# Patient Record
Sex: Female | Born: 1960 | Race: Black or African American | Hispanic: No | Marital: Single
Health system: Southern US, Community
[De-identification: ages and names within clinical notes are randomized; demographics above are authoritative.]

## PROBLEM LIST (undated history)

## (undated) DIAGNOSIS — H269 Unspecified cataract: Secondary | ICD-10-CM

## (undated) DIAGNOSIS — E785 Hyperlipidemia, unspecified: Secondary | ICD-10-CM

## (undated) DIAGNOSIS — E119 Type 2 diabetes mellitus without complications: Secondary | ICD-10-CM

## (undated) DIAGNOSIS — F32A Depression, unspecified: Secondary | ICD-10-CM

## (undated) DIAGNOSIS — I639 Cerebral infarction, unspecified: Secondary | ICD-10-CM

## (undated) DIAGNOSIS — I10 Essential (primary) hypertension: Secondary | ICD-10-CM

## (undated) DIAGNOSIS — I509 Heart failure, unspecified: Secondary | ICD-10-CM

## (undated) DIAGNOSIS — F419 Anxiety disorder, unspecified: Secondary | ICD-10-CM

## (undated) DIAGNOSIS — E039 Hypothyroidism, unspecified: Secondary | ICD-10-CM

## (undated) DIAGNOSIS — K219 Gastro-esophageal reflux disease without esophagitis: Secondary | ICD-10-CM

## (undated) DIAGNOSIS — R1310 Dysphagia, unspecified: Secondary | ICD-10-CM

## (undated) DIAGNOSIS — F329 Major depressive disorder, single episode, unspecified: Secondary | ICD-10-CM

## (undated) DIAGNOSIS — G8190 Hemiplegia, unspecified affecting unspecified side: Secondary | ICD-10-CM

## (undated) HISTORY — DX: Hemiplegia, unspecified affecting unspecified side: G81.90

## (undated) HISTORY — DX: Gastro-esophageal reflux disease without esophagitis: K21.9

## (undated) HISTORY — DX: Cerebral infarction, unspecified: I63.9

## (undated) HISTORY — DX: Depression, unspecified: F32.A

## (undated) HISTORY — DX: Hyperlipidemia, unspecified: E78.5

## (undated) HISTORY — DX: Essential (primary) hypertension: I10

## (undated) HISTORY — DX: Anxiety disorder, unspecified: F41.9

## (undated) HISTORY — DX: Type 2 diabetes mellitus without complications: E11.9

## (undated) HISTORY — DX: Unspecified cataract: H26.9

## (undated) HISTORY — DX: Dysphagia, unspecified: R13.10

## (undated) HISTORY — DX: Heart failure, unspecified: I50.9

## (undated) HISTORY — DX: Hypothyroidism, unspecified: E03.9

---
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
## 2003-07-20 ENCOUNTER — Other Ambulatory Visit: Payer: Self-pay

## 2003-07-24 ENCOUNTER — Inpatient Hospital Stay (HOSPITAL_COMMUNITY)
Admission: RE | Admit: 2003-07-24 | Discharge: 2003-08-16 | Payer: Self-pay | Admitting: Physical Medicine & Rehabilitation

## 2003-09-23 ENCOUNTER — Encounter
Admission: RE | Admit: 2003-09-23 | Discharge: 2003-12-22 | Payer: Self-pay | Admitting: Physical Medicine & Rehabilitation

## 2004-02-28 ENCOUNTER — Encounter: Payer: Self-pay | Admitting: Physical Medicine & Rehabilitation

## 2004-03-30 ENCOUNTER — Encounter: Payer: Self-pay | Admitting: Physical Medicine & Rehabilitation

## 2004-04-29 ENCOUNTER — Encounter: Payer: Self-pay | Admitting: Physical Medicine & Rehabilitation

## 2004-08-07 ENCOUNTER — Emergency Department: Payer: Self-pay | Admitting: Emergency Medicine

## 2004-09-30 ENCOUNTER — Emergency Department: Payer: Self-pay | Admitting: Emergency Medicine

## 2006-03-01 ENCOUNTER — Encounter: Payer: Self-pay | Admitting: Family Medicine

## 2006-09-01 ENCOUNTER — Inpatient Hospital Stay: Payer: Self-pay | Admitting: Internal Medicine

## 2006-09-01 ENCOUNTER — Other Ambulatory Visit: Payer: Self-pay

## 2008-04-17 ENCOUNTER — Inpatient Hospital Stay: Payer: Self-pay | Admitting: Internal Medicine

## 2010-02-25 ENCOUNTER — Emergency Department: Payer: Self-pay | Admitting: Emergency Medicine

## 2010-11-23 ENCOUNTER — Inpatient Hospital Stay: Payer: Self-pay | Admitting: *Deleted

## 2012-12-28 ENCOUNTER — Ambulatory Visit: Payer: Self-pay | Admitting: Internal Medicine

## 2013-01-09 LAB — CBC
HGB: 12.3 g/dL (ref 12.0–16.0)
MCH: 26.4 pg (ref 26.0–34.0)
Platelet: 367 10*3/uL (ref 150–440)
RDW: 19.7 % — ABNORMAL HIGH (ref 11.5–14.5)

## 2013-01-09 LAB — ETHANOL
Ethanol %: <0.003 % (ref 0.000–0.080)
Ethanol: <3 mg/dL

## 2013-01-09 LAB — DRUG SCREEN, URINE
Benzodiazepine, Ur Scrn: NEGATIVE (ref ?–200)
Cannabinoid 50 Ng, Ur ~~LOC~~: NEGATIVE (ref ?–50)
MDMA (Ecstasy)Ur Screen: NEGATIVE (ref ?–500)
Phencyclidine (PCP) Ur S: NEGATIVE (ref ?–25)
Tricyclic, Ur Screen: NEGATIVE (ref ?–1000)

## 2013-01-09 LAB — URINALYSIS, COMPLETE
Glucose,UR: 300 mg/dL (ref 0–75)
Nitrite: NEGATIVE
Ph: 6 (ref 4.5–8.0)
Specific Gravity: 1.005 (ref 1.003–1.030)
Squamous Epithelial: 6
WBC UR: 62 /HPF (ref 0–5)

## 2013-01-09 LAB — COMPREHENSIVE METABOLIC PANEL
Anion Gap: 11 (ref 7–16)
Bilirubin,Total: 2.9 mg/dL — ABNORMAL HIGH (ref 0.2–1.0)
Chloride: 100 mmol/L (ref 98–107)
Glucose: 499 mg/dL — ABNORMAL HIGH (ref 65–99)
SGOT(AST): 56 U/L — ABNORMAL HIGH (ref 15–37)
Sodium: 140 mmol/L (ref 136–145)

## 2013-01-09 LAB — TROPONIN I: Troponin-I: 0.56 ng/mL — ABNORMAL HIGH

## 2013-01-10 ENCOUNTER — Inpatient Hospital Stay: Payer: Self-pay | Admitting: Internal Medicine

## 2013-01-11 LAB — CBC WITH DIFFERENTIAL/PLATELET
Basophil #: 0.2 10*3/uL — ABNORMAL HIGH (ref 0.0–0.1)
Basophil %: 0.8 %
Eosinophil #: 0 10*3/uL (ref 0.0–0.7)
Eosinophil %: 0 %
HCT: 42 % (ref 35.0–47.0)
HGB: 13.5 g/dL (ref 12.0–16.0)
Lymphocyte %: 9.9 %
MCHC: 32.1 g/dL (ref 32.0–36.0)
MCV: 83 fL (ref 80–100)
Monocyte %: 7.7 %
Neutrophil #: 15 10*3/uL — ABNORMAL HIGH (ref 1.4–6.5)
Platelet: 280 10*3/uL (ref 150–440)
WBC: 18.4 10*3/uL — ABNORMAL HIGH (ref 3.6–11.0)

## 2013-01-11 LAB — COMPREHENSIVE METABOLIC PANEL
Alkaline Phosphatase: 79 U/L (ref 50–136)
BUN: 25 mg/dL — ABNORMAL HIGH (ref 7–18)
Calcium, Total: 8.8 mg/dL (ref 8.5–10.1)
Co2: 39 mmol/L — ABNORMAL HIGH (ref 21–32)
Creatinine: 1.25 mg/dL (ref 0.60–1.30)
EGFR (African American): 57 — ABNORMAL LOW
Glucose: 233 mg/dL — ABNORMAL HIGH (ref 65–99)
Potassium: 2.3 mmol/L — CL (ref 3.5–5.1)
SGOT(AST): 22 U/L (ref 15–37)
SGPT (ALT): 22 U/L (ref 12–78)
Sodium: 143 mmol/L (ref 136–145)
Total Protein: 8 g/dL (ref 6.4–8.2)

## 2013-01-11 LAB — LIPID PANEL
Cholesterol: 119 mg/dL (ref 0–200)
Ldl Cholesterol, Calc: 54 mg/dL (ref 0–100)
VLDL Cholesterol, Calc: 36 mg/dL (ref 5–40)

## 2013-01-11 LAB — HEMOGLOBIN A1C: Hemoglobin A1C: 8.2 % — ABNORMAL HIGH (ref 4.2–6.3)

## 2013-01-12 LAB — CREATININE, SERUM
Creatinine: 1.33 mg/dL — ABNORMAL HIGH (ref 0.60–1.30)
EGFR (Non-African Amer.): 46 — ABNORMAL LOW

## 2013-01-13 LAB — CBC WITH DIFFERENTIAL/PLATELET
Basophil #: 0 10*3/uL (ref 0.0–0.1)
Basophil %: 0.2 %
Eosinophil %: 1.3 %
Lymphocyte %: 15.6 %
MCH: 26.3 pg (ref 26.0–34.0)
MCV: 83 fL (ref 80–100)
Monocyte %: 5.5 %
Neutrophil #: 11.5 10*3/uL — ABNORMAL HIGH (ref 1.4–6.5)
Neutrophil %: 77.4 %
Platelet: 280 10*3/uL (ref 150–440)
RDW: 19.5 % — ABNORMAL HIGH (ref 11.5–14.5)
WBC: 14.9 10*3/uL — ABNORMAL HIGH (ref 3.6–11.0)

## 2013-01-13 LAB — BASIC METABOLIC PANEL
Anion Gap: 5 — ABNORMAL LOW (ref 7–16)
BUN: 28 mg/dL — ABNORMAL HIGH (ref 7–18)
Chloride: 94 mmol/L — ABNORMAL LOW (ref 98–107)
Co2: 39 mmol/L — ABNORMAL HIGH (ref 21–32)
Osmolality: 281 (ref 275–301)

## 2013-01-13 LAB — MAGNESIUM: Magnesium: 1.5 mg/dL — ABNORMAL LOW

## 2013-01-14 LAB — BASIC METABOLIC PANEL
Anion Gap: 2 — ABNORMAL LOW (ref 7–16)
Calcium, Total: 7.9 mg/dL — ABNORMAL LOW (ref 8.5–10.1)
Chloride: 96 mmol/L — ABNORMAL LOW (ref 98–107)
Creatinine: 1.27 mg/dL (ref 0.60–1.30)
EGFR (African American): 56 — ABNORMAL LOW
EGFR (Non-African Amer.): 48 — ABNORMAL LOW
Glucose: 112 mg/dL — ABNORMAL HIGH (ref 65–99)
Osmolality: 280 (ref 275–301)
Potassium: 3.3 mmol/L — ABNORMAL LOW (ref 3.5–5.1)
Sodium: 138 mmol/L (ref 136–145)

## 2013-01-14 LAB — URINE CULTURE

## 2013-01-14 LAB — CULTURE, BLOOD (SINGLE)

## 2013-01-14 LAB — WBC: WBC: 11.8 10*3/uL — ABNORMAL HIGH (ref 3.6–11.0)

## 2013-01-14 LAB — MAGNESIUM: Magnesium: 1.6 mg/dL — ABNORMAL LOW

## 2013-01-28 ENCOUNTER — Ambulatory Visit: Payer: Self-pay | Admitting: Nurse Practitioner

## 2013-02-27 ENCOUNTER — Ambulatory Visit: Payer: Self-pay | Admitting: Internal Medicine

## 2013-03-30 ENCOUNTER — Ambulatory Visit: Payer: Self-pay | Admitting: Nurse Practitioner

## 2013-05-30 ENCOUNTER — Ambulatory Visit: Payer: Self-pay | Admitting: Nurse Practitioner

## 2013-07-28 ENCOUNTER — Ambulatory Visit: Payer: Self-pay | Admitting: Nurse Practitioner

## 2013-08-28 ENCOUNTER — Ambulatory Visit: Payer: Self-pay | Admitting: Nurse Practitioner

## 2013-10-28 ENCOUNTER — Ambulatory Visit
Admit: 2013-10-28 | Disposition: A | Discharge status: Home / Self Care | Payer: Self-pay | Attending: Nurse Practitioner | Admitting: Nurse Practitioner

## 2013-11-27 ENCOUNTER — Ambulatory Visit
Admit: 2013-11-27 | Disposition: A | Discharge status: Home / Self Care | Payer: Self-pay | Attending: Nurse Practitioner | Admitting: Nurse Practitioner

## 2014-03-11 ENCOUNTER — Inpatient Hospital Stay: Payer: Self-pay | Admitting: Internal Medicine

## 2014-03-11 LAB — CK: CK, Total: 64 U/L

## 2014-03-11 LAB — BASIC METABOLIC PANEL
Anion Gap: 8 (ref 7–16)
BUN: 41 mg/dL — ABNORMAL HIGH (ref 7–18)
CO2: 29 mmol/L (ref 21–32)
Calcium, Total: 8.2 mg/dL — ABNORMAL LOW (ref 8.5–10.1)
Chloride: 103 mmol/L (ref 98–107)
Creatinine: 2.63 mg/dL — ABNORMAL HIGH (ref 0.60–1.30)
EGFR (Non-African Amer.): 20 — ABNORMAL LOW
GFR CALC AF AMER: 24 — AB
Glucose: 209 mg/dL — ABNORMAL HIGH (ref 65–99)
OSMOLALITY: 296 (ref 275–301)
Potassium: 2.9 mmol/L — ABNORMAL LOW (ref 3.5–5.1)
Sodium: 140 mmol/L (ref 136–145)

## 2014-03-11 LAB — COMPREHENSIVE METABOLIC PANEL
ALBUMIN: 2.5 g/dL — AB (ref 3.4–5.0)
ANION GAP: 14 (ref 7–16)
Alkaline Phosphatase: 112 U/L
BUN: 44 mg/dL — AB (ref 7–18)
Bilirubin,Total: 0.4 mg/dL (ref 0.2–1.0)
CHLORIDE: 96 mmol/L — AB (ref 98–107)
CREATININE: 2.82 mg/dL — AB (ref 0.60–1.30)
Calcium, Total: 9.2 mg/dL (ref 8.5–10.1)
Co2: 27 mmol/L (ref 21–32)
EGFR (African American): 23 — ABNORMAL LOW
EGFR (Non-African Amer.): 19 — ABNORMAL LOW
Glucose: 264 mg/dL — ABNORMAL HIGH (ref 65–99)
Osmolality: 294 (ref 275–301)
Potassium: 2.4 mmol/L — CL (ref 3.5–5.1)
SGOT(AST): 12 U/L — ABNORMAL LOW (ref 15–37)
SGPT (ALT): 12 U/L — ABNORMAL LOW
SODIUM: 137 mmol/L (ref 136–145)
Total Protein: 8.5 g/dL — ABNORMAL HIGH (ref 6.4–8.2)

## 2014-03-11 LAB — URINALYSIS, COMPLETE
Bilirubin,UR: NEGATIVE
GLUCOSE, UR: NEGATIVE mg/dL (ref 0–75)
Hyaline Cast: 3
NITRITE: NEGATIVE
Ph: 5 (ref 4.5–8.0)
RBC,UR: 2 /HPF (ref 0–5)
Specific Gravity: 1.013 (ref 1.003–1.030)
WBC UR: 158 /HPF (ref 0–5)

## 2014-03-11 LAB — CBC WITH DIFFERENTIAL/PLATELET
BASOS ABS: 0.1 10*3/uL (ref 0.0–0.1)
BASOS PCT: 0.6 %
EOS ABS: 0 10*3/uL (ref 0.0–0.7)
EOS PCT: 0.2 %
HCT: 31.7 % — ABNORMAL LOW (ref 35.0–47.0)
HGB: 9.9 g/dL — AB (ref 12.0–16.0)
LYMPHS ABS: 2.6 10*3/uL (ref 1.0–3.6)
LYMPHS PCT: 11.8 %
MCH: 25.7 pg — ABNORMAL LOW (ref 26.0–34.0)
MCHC: 31.2 g/dL — ABNORMAL LOW (ref 32.0–36.0)
MCV: 83 fL (ref 80–100)
Monocyte #: 1.4 x10 3/mm — ABNORMAL HIGH (ref 0.2–0.9)
Monocyte %: 6.3 %
NEUTROS ABS: 17.5 10*3/uL — AB (ref 1.4–6.5)
Neutrophil %: 81.1 %
Platelet: 536 10*3/uL — ABNORMAL HIGH (ref 150–440)
RBC: 3.84 10*6/uL (ref 3.80–5.20)
RDW: 14.9 % — ABNORMAL HIGH (ref 11.5–14.5)
WBC: 21.5 10*3/uL — ABNORMAL HIGH (ref 3.6–11.0)

## 2014-03-11 LAB — POTASSIUM: Potassium: 2.4 mmol/L — CL (ref 3.5–5.1)

## 2014-03-11 LAB — TROPONIN I: TROPONIN-I: 0.05 ng/mL

## 2014-03-11 LAB — LIPASE, BLOOD: LIPASE: 4721 U/L — AB (ref 73–393)

## 2014-03-11 LAB — MAGNESIUM: Magnesium: 1.9 mg/dL

## 2014-03-12 LAB — CBC WITH DIFFERENTIAL/PLATELET
Basophil #: 0 10*3/uL (ref 0.0–0.1)
Basophil %: 0.3 %
Eosinophil #: 0.1 10*3/uL (ref 0.0–0.7)
Eosinophil %: 0.4 %
HCT: 28.9 % — ABNORMAL LOW (ref 35.0–47.0)
HGB: 9 g/dL — ABNORMAL LOW (ref 12.0–16.0)
LYMPHS ABS: 1.7 10*3/uL (ref 1.0–3.6)
LYMPHS PCT: 12.3 %
MCH: 26 pg (ref 26.0–34.0)
MCHC: 30.9 g/dL — ABNORMAL LOW (ref 32.0–36.0)
MCV: 84 fL (ref 80–100)
MONO ABS: 1.1 x10 3/mm — AB (ref 0.2–0.9)
MONOS PCT: 7.7 %
NEUTROS ABS: 11 10*3/uL — AB (ref 1.4–6.5)
NEUTROS PCT: 79.3 %
Platelet: 349 10*3/uL (ref 150–440)
RBC: 3.44 10*6/uL — ABNORMAL LOW (ref 3.80–5.20)
RDW: 14.4 % (ref 11.5–14.5)
WBC: 13.9 10*3/uL — ABNORMAL HIGH (ref 3.6–11.0)

## 2014-03-12 LAB — LIPID PANEL
CHOLESTEROL: 69 mg/dL (ref 0–200)
HDL Cholesterol: 34 mg/dL — ABNORMAL LOW (ref 40–60)
Ldl Cholesterol, Calc: 18 mg/dL (ref 0–100)
Triglycerides: 83 mg/dL (ref 0–200)
VLDL CHOLESTEROL, CALC: 17 mg/dL (ref 5–40)

## 2014-03-12 LAB — COMPREHENSIVE METABOLIC PANEL
ALBUMIN: 2 g/dL — AB (ref 3.4–5.0)
ALK PHOS: 94 U/L
ANION GAP: 10 (ref 7–16)
BUN: 34 mg/dL — AB (ref 7–18)
Bilirubin,Total: 0.2 mg/dL (ref 0.2–1.0)
CHLORIDE: 107 mmol/L (ref 98–107)
CO2: 25 mmol/L (ref 21–32)
CREATININE: 2.21 mg/dL — AB (ref 0.60–1.30)
Calcium, Total: 8.2 mg/dL — ABNORMAL LOW (ref 8.5–10.1)
EGFR (African American): 30 — ABNORMAL LOW
GFR CALC NON AF AMER: 25 — AB
GLUCOSE: 198 mg/dL — AB (ref 65–99)
OSMOLALITY: 296 (ref 275–301)
POTASSIUM: 3.1 mmol/L — AB (ref 3.5–5.1)
SGOT(AST): 15 U/L (ref 15–37)
SGPT (ALT): 10 U/L — ABNORMAL LOW
SODIUM: 142 mmol/L (ref 136–145)
Total Protein: 6.8 g/dL (ref 6.4–8.2)

## 2014-03-12 LAB — MAGNESIUM: MAGNESIUM: 1.7 mg/dL — AB

## 2014-03-13 LAB — BASIC METABOLIC PANEL
ANION GAP: 7 (ref 7–16)
BUN: 22 mg/dL — AB (ref 7–18)
Calcium, Total: 8.5 mg/dL (ref 8.5–10.1)
Chloride: 108 mmol/L — ABNORMAL HIGH (ref 98–107)
Co2: 26 mmol/L (ref 21–32)
Creatinine: 1.89 mg/dL — ABNORMAL HIGH (ref 0.60–1.30)
GFR CALC AF AMER: 36 — AB
GFR CALC NON AF AMER: 30 — AB
GLUCOSE: 170 mg/dL — AB (ref 65–99)
OSMOLALITY: 289 (ref 275–301)
POTASSIUM: 3.3 mmol/L — AB (ref 3.5–5.1)
SODIUM: 141 mmol/L (ref 136–145)

## 2014-03-13 LAB — CBC WITH DIFFERENTIAL/PLATELET
BASOS PCT: 0.1 %
Basophil #: 0 10*3/uL (ref 0.0–0.1)
EOS ABS: 0.1 10*3/uL (ref 0.0–0.7)
EOS PCT: 0.7 %
HCT: 28 % — AB (ref 35.0–47.0)
HGB: 8.7 g/dL — AB (ref 12.0–16.0)
Lymphocyte #: 1.4 10*3/uL (ref 1.0–3.6)
Lymphocyte %: 10.5 %
MCH: 26.2 pg (ref 26.0–34.0)
MCHC: 31.1 g/dL — ABNORMAL LOW (ref 32.0–36.0)
MCV: 84 fL (ref 80–100)
MONO ABS: 1 x10 3/mm — AB (ref 0.2–0.9)
Monocyte %: 7.6 %
NEUTROS ABS: 11.1 10*3/uL — AB (ref 1.4–6.5)
Neutrophil %: 81.1 %
Platelet: 375 10*3/uL (ref 150–440)
RBC: 3.33 10*6/uL — ABNORMAL LOW (ref 3.80–5.20)
RDW: 14.5 % (ref 11.5–14.5)
WBC: 13.7 10*3/uL — ABNORMAL HIGH (ref 3.6–11.0)

## 2014-03-13 LAB — MAGNESIUM: MAGNESIUM: 1.9 mg/dL

## 2014-03-13 LAB — LIPASE, BLOOD: LIPASE: 1165 U/L — AB (ref 73–393)

## 2014-03-14 LAB — BASIC METABOLIC PANEL
ANION GAP: 8 (ref 7–16)
BUN: 14 mg/dL (ref 7–18)
CREATININE: 1.56 mg/dL — AB (ref 0.60–1.30)
Calcium, Total: 8.1 mg/dL — ABNORMAL LOW (ref 8.5–10.1)
Chloride: 109 mmol/L — ABNORMAL HIGH (ref 98–107)
Co2: 26 mmol/L (ref 21–32)
EGFR (African American): 45 — ABNORMAL LOW
EGFR (Non-African Amer.): 37 — ABNORMAL LOW
Glucose: 109 mg/dL — ABNORMAL HIGH (ref 65–99)
Osmolality: 286 (ref 275–301)
Potassium: 3 mmol/L — ABNORMAL LOW (ref 3.5–5.1)
Sodium: 143 mmol/L (ref 136–145)

## 2014-03-14 LAB — CBC WITH DIFFERENTIAL/PLATELET
BASOS ABS: 0 10*3/uL (ref 0.0–0.1)
Basophil %: 0.2 %
EOS ABS: 0.2 10*3/uL (ref 0.0–0.7)
Eosinophil %: 1.3 %
HCT: 24.5 % — AB (ref 35.0–47.0)
HGB: 7.4 g/dL — ABNORMAL LOW (ref 12.0–16.0)
LYMPHS PCT: 14 %
Lymphocyte #: 1.9 10*3/uL (ref 1.0–3.6)
MCH: 25.2 pg — AB (ref 26.0–34.0)
MCHC: 30.3 g/dL — AB (ref 32.0–36.0)
MCV: 83 fL (ref 80–100)
MONOS PCT: 8.2 %
Monocyte #: 1.1 x10 3/mm — ABNORMAL HIGH (ref 0.2–0.9)
Neutrophil #: 10.3 10*3/uL — ABNORMAL HIGH (ref 1.4–6.5)
Neutrophil %: 76.3 %
PLATELETS: 345 10*3/uL (ref 150–440)
RBC: 2.95 10*6/uL — AB (ref 3.80–5.20)
RDW: 14.2 % (ref 11.5–14.5)
WBC: 13.5 10*3/uL — AB (ref 3.6–11.0)

## 2014-03-14 LAB — LIPASE, BLOOD: Lipase: 960 U/L — ABNORMAL HIGH (ref 73–393)

## 2014-03-14 LAB — URINE CULTURE

## 2014-03-14 LAB — SEDIMENTATION RATE

## 2014-03-15 LAB — CBC WITH DIFFERENTIAL/PLATELET
BASOS ABS: 0.1 10*3/uL (ref 0.0–0.1)
Basophil %: 0.6 %
Eosinophil #: 0.2 10*3/uL (ref 0.0–0.7)
Eosinophil %: 2.4 %
HCT: 24.5 % — ABNORMAL LOW (ref 35.0–47.0)
HGB: 7.4 g/dL — AB (ref 12.0–16.0)
LYMPHS ABS: 2.1 10*3/uL (ref 1.0–3.6)
Lymphocyte %: 21.4 %
MCH: 25.7 pg — ABNORMAL LOW (ref 26.0–34.0)
MCHC: 30.3 g/dL — ABNORMAL LOW (ref 32.0–36.0)
MCV: 85 fL (ref 80–100)
MONOS PCT: 10.4 %
Monocyte #: 1 x10 3/mm — ABNORMAL HIGH (ref 0.2–0.9)
NEUTROS ABS: 6.4 10*3/uL (ref 1.4–6.5)
Neutrophil %: 65.2 %
Platelet: 336 10*3/uL (ref 150–440)
RBC: 2.88 10*6/uL — ABNORMAL LOW (ref 3.80–5.20)
RDW: 14.5 % (ref 11.5–14.5)
WBC: 9.8 10*3/uL (ref 3.6–11.0)

## 2014-03-15 LAB — URIC ACID: Uric Acid: 5.7 mg/dL (ref 2.6–6.0)

## 2014-03-16 LAB — CULTURE, BLOOD (SINGLE)

## 2014-03-17 LAB — CULTURE, BLOOD (SINGLE)

## 2014-08-29 ENCOUNTER — Ambulatory Visit: Payer: Self-pay | Admitting: Nurse Practitioner

## 2014-09-19 NOTE — H&P (Signed)
PATIENT NAME:  Jacqueline, Shelton MR#:  [id number] DATE OF BIRTH:  1961-04-09  DATE OF ADMISSION:  01/10/2013  PRIMARY CARE PHYSICIAN:  Nonlocal.   REFERRING PHYSICIAN:  Dr. Corky Downs.   CHIEF COMPLAINT:  Altered mental status.   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  The patient is bedbound and has multiple other medical problems.  She lives at home and she is brought into the ER for altered mental status.  According to the ER history it is gradual in onset with decreased responsiveness.  The patient being aphasic I was unable to get any history from the patient.  I tried to reach the family members at their phone number [phone], but  was unsuccessful and have left a voicemail.  The patient's chest x-ray has revealed pulmonary edema.  Troponin is elevated at 0.56 and the patient's blood sugar is high.  As the patient is in CHF aggressive IV hydration with IV fluids were not provided.  The patient has received blood cultures and urine cultures and initially was started on Zosyn and vancomycin in the ER itself.   PAST MEDICAL HISTORY:  History of stroke in 2003 and 2008, patient has right-sided hemiplegia and aphasia, basically bedbound and functionally quadriplegic.  Diabetes mellitus, hypertension, hyperlipidemia, hypothyroidism, depression, diabetic neuropathy, history of multiple urinary tract infection, history of seizures in the past.   PAST SURGICAL HISTORY:  Cholecystectomy, tubal ligation, C-section.   ALLERGIES:  No known drug allergies.  PSYCHOSOCIAL HISTORY:  Lives with family at home.  No history of smoking, alcohol or illicit drug usage according to the old medical records.   FAMILY HISTORY:  Mom has history of diabetes mellitus, hypertension, coronary artery disease.   REVIEW OF SYSTEMS:   Unobtainable as the  patient is nonverbal.   PHYSICAL EXAMINATION:  VITAL SIGNS:  Temperature 99.7, pulse 102 to 115, respirations 24, blood pressure is 184/79, pulse ox 98% on oxygen.  GENERAL APPEARANCE:  Morbidly obese, bedbound, not under any acute distress.  HEENT:  Normocephalic, atraumatic.  Pupils are equal, react to light and accommodation.  No scleral icterus.  No conjunctival injection.  No sinus tenderness.  Very dry mucous membranes.  NECK:  Supple.  No JVD.  No lymphadenopathy.  LUNGS:  Clear to auscultation bilaterally.  No accessory muscle usage.  No anterior chest wall tenderness on palpation.  CARDIAC:  S1, S2 normal.  Regular rate and rhythm.  No murmurs.  GASTROINTESTINAL:  Soft.  Bowel sounds are positive in all four quadrants.  Nontender, nondistended.  No hepatosplenomegaly.  NEUROLOGIC:  The patient is lethargic, bedbound, nonverbal, but she responds to verbal commands and tracks people with her eyes.  SKIN:  Warm to touch.  Normal turgor.  No rashes.  No lesions.  EXTREMITIES:  1+ pitting edema is present.    LABORATORY AND IMAGING STUDIES:  Glucose initially 499, repeat Accu-Chek was 74.  BNP 4233, BUN 25, creatinine 1.04, sodium 140, potassium 3.0, chloride 100, CO2 29.  GFR greater than 60.  Anion gap is 11, serum osmolality 306.  Urine drug screen is negative.  Troponin 0.56.  ABGs, pH 7.52, pCO2 43, pO2 133, FiO2 of 45%, bicarb is 35.1.  A 12-lead EKG, sinus tachycardia at 107 beats per minute.  Normal PR and QRS interval, nonspecific ST-T wave changes.  Chest x-ray reveals diffuse patchy  infiltrates and cardiomegaly.   ASSESSMENT AND PLAN:  A 54 year old female who is morbidly obese, is sent over to the ER for altered mental status, will be admitted with the following assessment and plan.  1.  Altered mental status, probably from sepsis from urinary tract infection and aspiration pneumonia.  Blood cultures were obtained.  The patient will be on Zosyn and vancomycin.  2.  Congestive heart  failure with elevated BNP.  Cycle cardiac biomarkers.  Troponin is elevated which could be from congestive heart failure versus demand ischemia.  We will give her Lasix IV.  The patient will be on beta-blocker and statin.  We will check daily weights.  3.  Hyperglycemia with uncontrolled diabetes mellitus.  The patient is on insulin drip to titrate the blood sugars.  4.  Positive troponin, probably from new onset congestive heart failure, demand ischemia.  5.  History of hypothyroidism.    No family members were available to discuss about the patient's case and diagnosis.  Call placed to family [phone], but could not reach any of them, I have left a voicemail.    The patient will be on GI and DVT prophylaxis.   Total critical care time spent is 60 minutes.    ____________________________ Nicholes Mango, MD ag:ea D: 01/10/2013 02:06:33 ET T: 01/10/2013 04:01:16 ET JOB#: [id number]  cc: Nicholes Mango, MD, <Dictator> Nicholes Mango MD ELECTRONICALLY SIGNED 01/11/2013 6:54

## 2014-09-19 NOTE — Discharge Summary (Signed)
PATIENT NAME:  Jacqueline Shelton, Jacqueline Shelton MR#:  [id number] DATE OF BIRTH:  24-Oct-1960  DATE OF ADMISSION:  01/10/2013 DATE OF DISCHARGE:  01/14/2013  ADMITTING PHYSICIAN:  Dr. Margaretmary Eddy  DISCHARGING PHYSICIAN:  Dr. Gladstone Lighter    PRIMARY CARE PHYSICIAN: Not known.   CONSULTATIONS IN THE HOSPITAL:  Palliative care consultation by Dr. Izora Gala Phifer.   DISCHARGE DIAGNOSES: 1.  Sepsis.  2.  Metabolic encephalopathy.  3.  Extended spectrum beta-lactamase, Escherichia coli urinary tract infection.  4.  Aspiration pneumonia.  5.  Hypertension.  6.  Insulin-dependent diabetes mellitus.  7.  Acute on chronic diastolic congestive heart failure with ejection fraction of greater than 55%.  8.  Hypokalemia.  9.  Hypothyroidism.  10.  History of cerebrovascular accident with baseline bedbound status and nonverbal.   DISCHARGE HOME MEDICATIONS:  1.  Trazodone 50 mg p.o. at bedtime as needed for sleep.  2.  Levothyroxine 50 mcg p.o. daily.  3.  Pravastatin 20 mg p.o. daily.  4.  Celexa 20 mg p.o. daily.  5.  Lovenox 40 mg subcutaneous b.i.d. for deep vein thrombosis prophylaxis only as the patient is bedbound.   6.  Insulin isophane with insulin regular 70/30 recombinant  suspension 40 units subcutaneously b.i.d.  7.  Aspirin 325 mg p.o. daily.  8.  Metoprolol 50 mg p.o. b.i.d.  9.  Invanz 1 gram p.o. daily for 9 days.  10.  Lasix 40 mg p.o. b.i.d.  11.  Senna 5 mL oral twice daily.  12.  Colace 5 mL orally b.i.d.  13.  Potassium chloride 20 mEq p.o. b.i.d.  14.  Magnesium oxide 400 mg  p.o. b.i.d.  15.  Augmentin 875 mg p.o. b.i.d. for 5 more days.  16.  Ibuprofen  200 mg b.i.d. p.r.n. for pain.   DISCHARGE DIET: Pureed diet. Please send mash potatoes with extra gravy on lunch and dinner trays, chocolate pudding on all trays, hot cereal in the a.m. with butter and yogurt 3 times a day with meals.     FOLLOWUP INSTRUCTIONS: 1.  PCP followup in 1 to 2 weeks.  2.  Basic metabolic panel check in 5  days.  3.  PICC line care as per protocol and discontinue the PICC line after finishing the IV antibiotics.   LABS AND IMAGING STUDIES PRIOR TO DISCHARGE:  1.   WBC 11.8, hemoglobin is 11.5, hematocrit 36.1, platelet count 280.  2.  Sodium 138. Potassium 3.3, which was replaced. Chloride 96, bicarbonate 40, BUN is 22, creatinine 1.2, glucose 112, and calcium of 7.9.  3. Chest x-ray done showing shallow inspiration, cardiac silhouette is prominent, hypoinflation with mild interstitial edema, left lung base atelectasis or infiltrate cannot be excluded.  4.  Echo Doppler showing normal LV systolic function. Ejection fraction of 55% to 60%. Normal global LV systolic  function. Moderate concentric left ventricular hypertrophy. Moderately increased LV septal thickness is present.  HbA1c is 8.2 5.  TSH 1.6. LDL cholesterol 54, HDL 29, total cholesterol 119, triglycerides of 182.  6.  A urine culture is growing extended spectrum beta-lactamase Escherichia coli. Blood cultures remain negative.  7.  CT of the head showing right maxillary sinusitis. No acute intracranial abnormality.   BRIEF HOSPITAL COURSE:  Jacqueline Shelton is a 54 year old obese African American female with past medical history significant for prior history of stroke, which resulted in right-sided hemiplegia, aphasia, bedbound status, functional quadriplegia, hypertension, diabetes, hypothyroidism, depression, neuropathy, previous prior multiple UTIs in the past, history of seizure disorder.  Was brought in secondary to altered mental status. The patient was at the rehab before, but currently living at home with her daughter. Though she is aphasic, she is usually alert and interacts with family; however, was very confused according to the family on admission.  1.  Altered mental status, metabolic encephalopathy, likely from underlying sepsis, possible urinary tract infection, and aspiration pneumonia are suspected. Her urine cultures came back  positive for extended spectrum beta-lactamase urinary tract infection. The patient was initially on vancomycin and Zosyn and azithromycin for aspiration pneumonia. Once extended spectrum beta-lactamase cultures came back positive, she was placed in contact isolation and  started on intravenous Invanz on 01/13/2013 and will need a total of 10 days' treatment for that. She has a peripherally inserted central catheter line placed for the same.  2.  For her aspiration pneumonia, the patient was treated with Zosyn and is being changed over to Augmentin to finish off the course. Her white count has immensely improved and almost close to normal, and she has been afebrile while in the hospital. Speech therapy has evaluated patient and recommended pureed diets with normal thin liquids.  3.  Acute on chronic congestive heart failure exacerbation, diastolic dysfunction, ejection fraction is greater than 55%, mildly elevated troponin secondary to congestive heart failure and demand ischemia, but not acute myocardial infarction. She was diuresed with intravenous Lasix. This resulted in hypokalemia, which is being replaced, and she will be discharged on Lasix and also potassium supplements. The patient is already on metoprolol and also statin and aspirin. Continue to watch for any weight gain, with daily weights, intakes and outputs monitoring.  4.  Uncontrolled diabetes mellitus. The patient initially required insulin drip likely secondary to sepsis. Sugars were elevated; however, it improved and is back to 70/30 insulin 40 units b.i.d.  5.  Hypertension. Home medications were continued.  6.  History of prior cerebrovascular accident with right-sided hemiplegia and nonverbal status and bedbound functional quadriplegia. The patient was seen by palliative care and they discussed with the family and they decided for her code status to be a DO NOT RESUSCITATE.   CODE STATUS: DO NOT RESUSCITATE.   DISCHARGE DISPOSITION: To  [facility].   DISCHARGE CONDITION: Stable.   TIME SPENT ON DISCHARGE: 45 minutes.   ____________________________ Gladstone Lighter, MD rk:cc D: 01/14/2013 13:50:00 ET T: 01/14/2013 14:10:10 ET JOB#: [id number]  cc: Gladstone Lighter, MD, <Dictator> Gladstone Lighter MD ELECTRONICALLY SIGNED 02/06/2013 16:07

## 2014-09-20 NOTE — H&P (Signed)
PATIENT NAME:  EMMANUELLA, MIRANTE MR#:  [id number] DATE OF BIRTH:  1961-04-09  DATE OF ADMISSION:  03/11/2014  CHIEF COMPLAINT: Vomiting, abdominal pain.   HISTORY OF PRESENT ILLNESS: A 54 year old African American female patient with history of prior stroke with aphasia, right-sided hemiplegia, resident of a nursing home presents to the Emergency Room, sent in for 3 days of vomiting and abdominal pain. The patient initially was thought to be constipated after an x-ray was done at the nursing home, but today her symptoms are persistent, was sent to the Emergency Room. Here her x-ray does not show any bowel obstruction, but her lipase is significantly elevated at 4000. She has acute renal failure, is hypokalemic, tachycardic and is being admitted to the hospitalist service, also temperature of 100.1. The patient is unable to contribute to history secondary to her aphasia. History has been obtained from family at bedside, ER staff, and old records.   The patient was last seen in the hospital in August 2014 when she was admitted for sepsis and ESBL UTI, was discharged back to nursing home and family mentions that she has done well since. She is bedbound, gets into wheelchair with help, can talk 1 to 2 words at times but she is unable to at this point secondary to weakness.   PAST MEDICAL HISTORY: 1.  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  2.  Diabetes mellitus.  3.  Hypertension.  4.  Hyperlipidemia.  5.  Hypothyroidism.  6.  Depression.  7.  Diabetic neuropathy.  8.  Recurrent UTIs.  9.  Seizures in the past.  10.  Diastolic CHF.   PAST SURGICAL HISTORY:  1.  Cholecystectomy. 2.  Tubal ligation.  3.  C-section.   ALLERGIES: No known drug allergies.   SOCIAL HISTORY: The patient resides at a nursing home. No smoking. No alcohol. No illicit drug use.   CODE STATUS: DNR/DNI.   FAMILY HISTORY: Diabetes, hypertension, and CAD in her mother.   REVIEW OF  SYSTEMS: Unobtainable secondary to the patient's aphasia.   HOME MEDICATIONS:  1.  Amitiza 24 mcg oral 2 times a day.  2.  Colcrys 0.6 mg once a day.  3.  Docusate 5 mL oral 2 times a day as needed for constipation.  4.  Eliquis 5 mg oral 2 times a day.  Please see continuation addendum  ____________________________ Leia Alf. Saroya Riccobono, MD srs:sb D: 03/11/2014 15:10:55 ET T: 03/11/2014 15:34:53 ET JOB#: [id number]  cc: Alveta Heimlich R. Orla Estrin, MD, <Dictator> Neita Carp MD ELECTRONICALLY SIGNED 03/12/2014 17:04

## 2014-09-20 NOTE — H&P (Signed)
PATIENT NAME:  Jacqueline Shelton, Jacqueline Shelton MR#:  [id number] DATE OF BIRTH:  1960-10-15  DATE OF ADMISSION:  03/11/2014  ADDENDUM  HOME MEDICATIONS: (Continued) 1.  Lasix 40 mg oral 2 times a day.  2.  Humalog 75/25 40 units subcutaneous once a day.  3.  Ibuprofen 800 mg 3 times a day as needed.  4.  Levothyroxine 50 mcg daily. 5.  Mapap 325 mg 2 tablets every 4 hours as needed. 6.  Metoprolol tartrate 50 mg oral 2 times a day.  7.  Omeprazole 20 mg daily.  8.  Potassium chloride 20 mEq oral 2 times a day.  9.  Pravastatin 20 mg daily.  10.  Promethazine 25 mg intramuscular every 6 hours as needed.  11.  Senna 5 mL as needed for constipation.  12.  Senokot 8.6 mg 2 tablets once a day.  13.  Tramadol 50 mg 2 tablets oral 3 times a day.   PHYSICAL EXAMINATION: VITAL SIGNS: Temperature 97.8, pulse 120, respirations 20, blood pressure 127/76, saturating 100% on room air. Temperature was 100.1 previously.  GENERAL: Obese, African American female patient lying in bed in mild distress secondary to the pain.  PSYCHIATRIC: Alert and awake but aphasic.  HEENT: Head normocephalic. Oral mucosa dry and pink. External ears and nose normal. No pallor or icterus. Pupils bilaterally equal and reactive to light.  NECK: Supple. No thyromegaly or palpable lymph nodes. Trachea midline. No carotid bruit or JVD. CARDIOVASCULAR: S1 and S2 tachycardic without any murmurs. Peripheral pulses 2+. No edema.  RESPIRATORY: Normal work of breathing. Clear to auscultation and percussion.  GASTROINTESTINAL: Soft abdomen. Tenderness in the epigastric and left upper quadrant area. Bowel sounds present. No hepatosplenomegaly palpable.  GENITOURINARY: No CVA tenderness or bladder distention.  SKIN: Warm and dry. No petechiae or rash. MUSCULOSKELETAL: No joint swelling, redness, or effusion in large joints. Normal muscle tone.  NEUROLOGIC: Motor strength 1/5 on right side and 4/5 on the left side.  LYMPHATIC: No cervical  lymphadenopathy.   DIAGNOSTIC DATA: Glucose 264, BUN 44, creatinine 2.82 with sodium 137, potassium 2.4, and chloride 96 with AST, ALT, alk phos and bilirubin normal. Troponin 0.05. CK 64.  WBC 21.5, hemoglobin 9.9, platelets 536,000, neutrophils 81%, no bands.  Urinalysis shows 3+ bacteria and 158 WBCs.   EKG shows sinus tachycardia.   Abdominal x-ray shows no bowel obstruction, but moderate stool in the rectum with small distention of loop of sigmoid colon with moderate stool in the rectum. No obstruction.   ASSESSMENT AND PLAN: 1.  Acute pancreatitis of unknown etiology in a patient who has had cholecystectomy in the past. Presently LFTs are normal. She does not drink alcohol. We will put her on IV fluids, pain medications and symptomatic medication for her nausea. The patient will be continued on her diet as tolerated. We will get a CT scan of the abdomen and pelvis to look for any necrosis or abscess as she does have fever, tachycardia, and significantly elevated white count. Cannot give contrast due to acute renal failure.  2.  Acute renal failure secondary to significant dehydration along with hyperkalemia. We will put her on aggressive IV fluid resuscitation. She has received 1 unit of normal saline, but her heart rate is still in the 130s at this point. We will bolus another liter of normal saline and put her on 150 mL of normal saline after that. Replace potassium through IV. The patient will be monitored closely. Dehydration secondary to vomiting and decreased intake.  3.  Diabetes mellitus. The patient will be continued on home Lantus, but at a lower dose along with sliding scale insulin.  4.  Hypertension. Continue medications.  5.  Deep vein thrombosis prophylaxis. The patient is on Eliquis.  6. Severe sepsis- Source likely pancreatitis or UTI. IVF. 7. On meropenem to cover for pancreatitc source of infection. Has h/o ESBL  CODE STATUS: DNR/DNI.   TIME SPENT IN CRITICAL CARE  TIME: 40 minutes.   ____________________________ Leia Alf Anelis Hrivnak, MD srs:sb D: 03/11/2014 16:13:05 ET T: 03/11/2014 16:31:41 ET JOB#: [id number]  cc: Alveta Heimlich R. Lillian Ballester, MD, <Dictator> Neita Carp MD ELECTRONICALLY SIGNED 03/12/2014 17:04

## 2014-09-20 NOTE — Consult Note (Signed)
Brief Consult Note: Diagnosis: Gout versus cellulitis right knee.   Patient was seen by consultant.   Recommend to proceed with surgery or procedure.   Recommend further assessment or treatment.   Orders entered.   Comments: 54 year old female status post CVA with right hemiplegia admitted 3 days ago for pancreatitis with nausea/vomiting.  white blood count has been elevated at about 13,500 since admission but very little fever (99.5*)  Blood cultures growing coag neg staph. Started complaining of right knee pain today and increased warmth noted.  Patient indicates that she has a history of gout although it is not listed.  She is non ambulatory.  Erythrocyte sedimentation rate 140.  Potassium still low.  On Eliquis.    Exam:  Awake and alert, Dysphagic but appers to understand and comprehend well.  Hemiplegic on right side.  Right knee warm but not red.  No real effusion.  Passive range of motion not very painfu.  Sensation intact.  Left knee normal..  X-rays:  Mild arthritis, osteopenia. Severe calcification of vessels.  Imp:  Gout versus cellulitis  Rx: Continue IV antibiotics for + blood cultures of coag neg staph.       uric acid level       colchicine 0.6 mg q8h.       RE-evaluate tomorrow.  Electronic Signatures: Park Breed (MD)  (Signed 16-Oct-15 15:42)  Authored: Brief Consult Note   Last Updated: 16-Oct-15 15:42 by Park Breed (MD)

## 2014-09-20 NOTE — Discharge Summary (Signed)
Dates of Admission and Diagnosis:  Date of Admission 11-Mar-2014   Date of Discharge 16-Mar-2014   Admitting Diagnosis Acute pancreatitis   Final Diagnosis 1. ESBL E coli and klebsiella UTI 2. ARF 3. Right knee gout 4. AOCD 5. Acute pancreatitis 6. IDDM 7. Hypokalemia 8. h/o CVA with aphasia 9. Dehydration    Chief Complaint/History of Present Illness iting, abdominal pain.   HISTORY OF PRESENT ILLNESS: A 54 year old African American female patient with history of prior stroke with aphasia, right-sided hemiplegia, resident of a nursing home presents to the Emergency Room, sent in for 3 days of vomiting and abdominal pain. The patient initially was thought to be constipated after an x-ray was done at the nursing home, but today her symptoms are persistent, was sent to the Emergency Room. Here her x-ray does not show any bowel obstruction, but her lipase is significantly elevated at 4000. She has acute renal failure, is hypokalemic, tachycardic and is being admitted to the hospitalist service, also temperature of 100.1. The patient is unable to contribute to history secondary to her aphasia. History has been obtained from family at bedside, ER staff, and old records.   The patient was last seen in the hospital in August 2014 when she was admitted for sepsis and ESBL UTI, was discharged back to nursing home and family mentions that she has done well since. She is bedbound, gets into wheelchair with help, can talk 1 to 2 words at times but she is unable to at this point secondary to weakness.   Allergies:  No Known Allergies:   Hepatic:  13-Oct-15 11:27   Bilirubin, Total 0.4  Alkaline Phosphatase 112 (46-116 NOTE: New Reference Range 12/17/13)  SGPT (ALT)  12 (14-63 NOTE: New Reference Range 12/17/13)  SGOT (AST)  12  Total Protein, Serum  8.5  Albumin, Serum  2.5  Routine Chem:  13-Oct-15 11:27   Lipase  4721 (Result(s) reported on 11 Mar 2014 at 12:29PM.)  Glucose, Serum   264  BUN  44  Creatinine (comp)  2.82  Sodium, Serum 137  Potassium, Serum  2.4  Chloride, Serum  96  CO2, Serum 27  Calcium (Total), Serum 9.2  Anion Gap 14  Osmolality (calc) 294  eGFR (African American)  23  eGFR (Non-African American)  19 (eGFR values <72m/min/1.73 m2 may be an indication of chronic kidney disease (CKD). Calculated eGFR, using the MRDR Study equation, is useful in  patients with stable renal function. The eGFR calculation will not be reliable in acutely ill patients when serum creatinine is changing rapidly. It is not useful in patients on dialysis. The eGFR calculation may not be applicable to patients at the low and high extremes of body sizes, pregnant women, and vetetarians.)  Magnesium, Serum 1.9 (1.8-2.4 THERAPEUTIC RANGE: 4-7 mg/dL TOXIC: > 10 mg/dL  -----------------------)  Result Comment POTASSIUM - NOTIFIED OF CRITICAL VALUE  - LAB/STEPHANIE WOOD AT 1244 03/11/14  - READ-BACK PROCESS PERFORMED.  Result(s) reported on 11 Mar 2014 at 11:52AM.  16-Oct-15 04:30   Lipase  960 (Result(s) reported on 14 Mar 2014 at [facility])  Glucose, Serum  109  BUN 14  Creatinine (comp)  1.56  Sodium, Serum 143  Potassium, Serum  3.0  Chloride, Serum  109  CO2, Serum 26  Calcium (Total), Serum  8.1  Anion Gap 8  Osmolality (calc) 286  eGFR (African American)  45  eGFR (Non-African American)  37 (eGFR values <653mmin/1.73 m2 may be an indication of chronic kidney disease (  CKD). Calculated eGFR, using the MRDR Study equation, is useful in  patients with stable renal function. The eGFR calculation will not be reliable in acutely ill patients when serum creatinine is changing rapidly. It is not useful in patients on dialysis. The eGFR calculation may not be applicable to patients at the low and high extremes of body sizes, pregnant women, and vetetarians.)  Cardiac:  13-Oct-15 11:27   CK, Total 64 (26-192 NOTE: NEW REFERENCE RANGE  07/01/2013)  Troponin  I 0.05 (0.00-0.05 0.05 ng/mL or less: NEGATIVE  Repeat testing in 3-6 hrs  if clinically indicated. >0.05 ng/mL: POTENTIAL  MYOCARDIAL INJURY. Repeat  testing in 3-6 hrs if  clinically indicated. NOTE: An increase or decrease  of 30% or more on serial  testing suggests a  clinically important change)  Routine Hem:  13-Oct-15 11:27   WBC (CBC)  21.5  RBC (CBC) 3.84  Hemoglobin (CBC)  9.9  Hematocrit (CBC)  31.7  Platelet Count (CBC)  536  MCV 83  MCH  25.7  MCHC  31.2  RDW  14.9  Neutrophil % 81.1  Lymphocyte % 11.8  Monocyte % 6.3  Eosinophil % 0.2  Basophil % 0.6  Neutrophil #  17.5  Lymphocyte # 2.6  Monocyte #  1.4  Eosinophil # 0.0  Basophil # 0.1 (Result(s) reported on 11 Mar 2014 at 11:52AM.)   PERTINENT RADIOLOGY STUDIES: LabUnknown:    13-Oct-15 12:13, Urine Culture  ESBL POSITIVE    13-Oct-15 16:16, CT Abdomen and Pelvis Without Contrast  PACS Image   CT:  CT Abdomen and Pelvis Without Contrast   REASON FOR EXAM:    (1) abd pain, elevated lipase. fever. sepsis; (2) abd   pain, elevated lipase. fev  COMMENTS:       PROCEDURE: CT  - CT ABDOMEN AND PELVIS W0  - Mar 11 2014  4:16PM     CLINICAL DATA:  54 year old female with 3 days of acute abdominal  pain in all quadrants and vomiting. Abnormal like pace and low-grade  fever. Initial encounter.    EXAM:  CT ABDOMEN AND PELVIS WITHOUT CONTRAST    TECHNIQUE:  Multidetector CT imaging of the abdomen and pelvis was performed  following the standard protocol without IV contrast.    COMPARISON:  Acute abdominal series 12 31 hr today.    FINDINGS:  Large body habitus. Mild motion artifact at the lung bases. No  pericardial or pleural effusion. Mild lower lobe atelectasis.    Degenerative changes in the spine. No acute osseous abnormality  identified.    Widespread visible lower extremity calcified atherosclerosis. Some  medium vessel calcified plaque also noted in the abdomen (SMA).    No pelvic  free fluid. Stool ball in the rectum. Trace gas within the  bladder. Uterus surgically absent. Negative adnexal.    Redundant sigmoid colon containing gas and fluid extending into the  epigastric region. No sigmoid wall thickening. Redundant but  decompressed left colon. Negative transverse colon. Redundant but  decompressed hepatic flexure. Negative cecum. No dilated small  bowel. Oral contrast has reached the cecum. Negative stomach and  duodenum.    Surgically absent gallbladder. Negative non contrast liver, spleen,  adrenal glands, and kidneys. Decompressed ureters. No abdominal free  fluid.    Negative non contrast CT appearance of the pancreas. No  pneumoperitoneum. No focal inflammatory stranding identified in the  abdomen.     IMPRESSION:  1. No CT changes of acute pancreatitis. No free fluid or focal  inflammation in the abdomen or pelvis.  2. Stool ball in the rectum. Redundant distal colon probably  reflects a degree of colonic inertia.      Electronically Signed    By: Lars Pinks M.D.    On: 03/11/2014 16:31         Verified By: Gwenyth Bender. HALL, M.D.,   Pertinent Past History:  Pertinent Past History PAST MEDICAL HISTORY: 1.  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  2.  Diabetes mellitus.  3.  Hypertension.  4.  Hyperlipidemia.  5.  Hypothyroidism.  6.  Depression.  7.  Diabetic neuropathy.  8.  Recurrent UTIs.  9.  Seizures in the past.  10.  Diastolic CHF.   Hospital Course:  Hospital Course * Right knee Gout - [place] ESR > 140 Xray shows no fracture/dislocation. Some effusion. Appreciate Dr. Ammie Ferrier help  * Acute pancreatitis of unknown etiology cont  IV fluids, pain control. Lipase trensing down. No tenderness on exam. CT abd showed nothing acute  * ARF over CKD3 due to dehydration Improving. Stop IVF  * ESBL E coli and klebsiella UTI On meropenem. Change to Nitrofurantoin  * GPCC in 1/2  bottles Likely contaminant  * Severe sepsis Resolved  * Constipation resolved  *  Diabetes mellitus. SL.  Conitnue Insulin 75/25 20 Units BID AC  *  Hypertension. hold lopressor due  to low BP.  *  H/O CVA. on Eliquis.  Time spent on discharge 40 minutes   Condition on Discharge Fair   Code Status:  Code Status No Code/Do Not Resuscitate   PHYSICAL EXAM ON DISCHARGE:  Physical Exam:  GEN no acute distress, obese   HEENT pale conjunctivae   RESP normal resp effort  clear BS   ABD denies tenderness  soft  normal BS   SKIN normal to palpation   PSYCH alert   VITAL SIGNS:  Vital Signs: **Vital Signs.:   18-Oct-15 04:18  Vital Signs Type Routine  Temperature Temperature (F) 97.4  Celsius 36.3  Temperature Source oral  Pulse Pulse 89  Respirations Respirations 20  Systolic BP Systolic BP 696  Diastolic BP (mmHg) Diastolic BP (mmHg) 71  Mean BP 83  Pulse Ox % Pulse Ox % 97  Pulse Ox Activity Level  At rest  Oxygen Delivery Room Air/ 21 %   DISCHARGE INSTRUCTIONS HOME MEDS:  Medication Reconciliation: Patient's Home Medications at Discharge:     Medication Instructions  levothyroxine 50 mcg (0.05 mg) oral tablet  1 tab(s) orally once a day   pravastatin 20 mg oral tablet  1 tab(s) orally once a day (at bedtime)   senna 8.8 mg/5 ml oral syrup  5 milliliter(s) orally 2 times a day, As needed, constipation   docusate 10 mg/ml oral liquid  5 milliliter(s) orally 2 times a day, As needed, constipation   amitiza 24 mcg oral capsule  1 cap(s) orally 2 times a day   demacloud  Apply topically to affected area , As Needed   ibuprofen 800 mg oral tablet  1 tab(s) orally 3 times a day, As Needed   mapap 325 mg oral tablet  2 tab(s) orally every 4 hours, As Needed   eliquis 5 mg oral tablet  1 tab(s) orally 2 times a day   glucagon emergency kit for low blood sugar recombinant 1 mg injectable powder for injection  1 milligram(s) injectable every hour, As Needed    omeprazole 20 mg oral  delayed release capsule  1 cap(s) orally once a day   promethazine 25 mg/ml injectable solution  25 milligram(s) intramuscular every 6 hours, As Needed   senokot 8.6 mg oral tablet  2 tab(s) orally once a day (at bedtime)   tramadol 50 mg oral tablet  2 tab(s) orally 3 times a day   potassium chloride 20 meq oral tablet, extended release  2 tab(s) orally 2 times a day   colcrys 0.6 mg oral tablet  1 tab(s) orally 2 times a day PRN for gout attcaks   humalog mix 75/25 25 units-75 units/ml subcutaneous suspension  20 unit(s) subcutaneous 2 times a day   toprol-xl 25 mg oral tablet, extended release  1 tab(s) orally once a day   macrodantin 100 mg oral capsule  1 cap(s) orally 2 times a day   gabapentin 300 mg oral capsule  1 cap(s) orally 2 times a day    STOP TAKING THE FOLLOWING MEDICATION(S):    furosemide 40 mg oral tablet: 1 tab(s) orally 2 times a day  Physician's Instructions:  Diet Carbohydrate Controlled (ADA) Diet   Activity Limitations As tolerated   Return to Work Not Applicable   Time frame for Follow Up Appointment 1-2 weeks  PCP   Electronic Signatures: Shriyans Kuenzi, Lottie Dawson (MD)  (Signed 18-Oct-15 09:28)  Authored: ADMISSION DATE AND DIAGNOSIS, CHIEF COMPLAINT/HPI, Allergies, PERTINENT LABS, PERTINENT RADIOLOGY STUDIES, PERTINENT PAST HISTORY, HOSPITAL COURSE, PHYSICAL EXAM ON DISCHARGE, VITAL SIGNS, DISCHARGE INSTRUCTIONS HOME MEDS, PATIENT INSTRUCTIONS   Last Updated: 18-Oct-15 09:28 by Alba Destine (MD)

## 2015-10-24 IMAGING — CT CT ABD-PELV W/O CM
2 of 4 series · 17 of 46 positions shown, 19 images · non-contrast
Comparison: Acute abdominal series [DATE] hr today.

CLINICAL DATA: 53-year-old female with 3 days of acute abdominal
pain in all quadrants and vomiting. Abnormal like pace and low-grade
fever. Initial encounter.

EXAM:
CT ABDOMEN AND PELVIS WITHOUT CONTRAST
TECHNIQUE: Multidetector CT imaging of the abdomen and pelvis was performed
following the standard protocol without IV contrast.

[Series 2: routine abd pel without · axial · non-contrast · 0.93mm/px · z∈[-442,+3]mm · 14 of 99 slices shown, 16 images]
[im 5/99  soft-tissue]
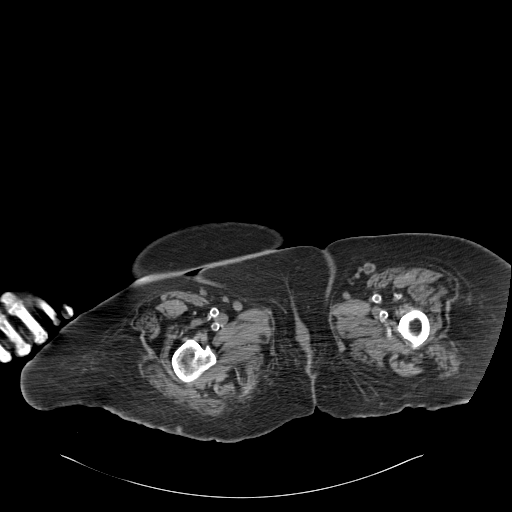
[im 5/99  bone]
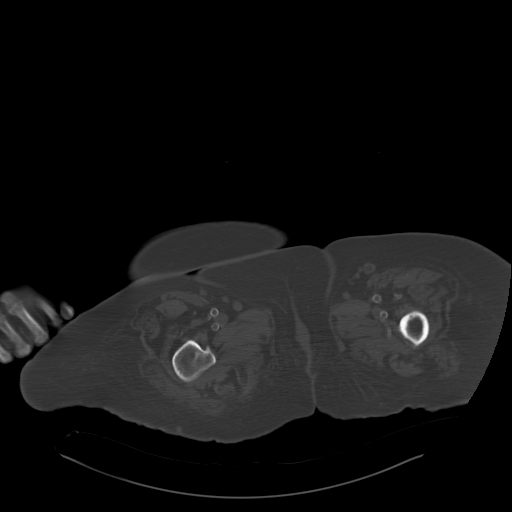
[im 13/99  soft-tissue]
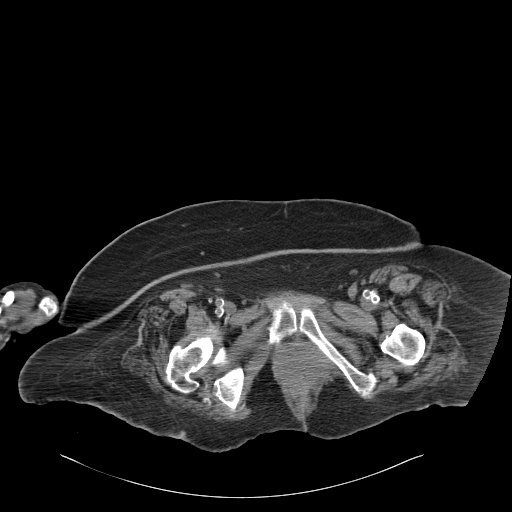
[im 21/99  soft-tissue]
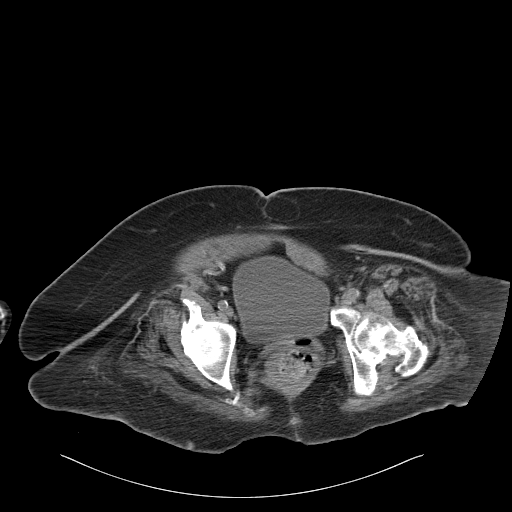
[im 25/99  soft-tissue]
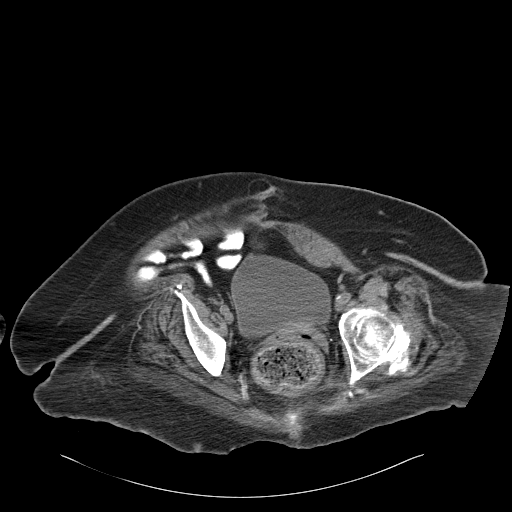
[im 33/99  soft-tissue]
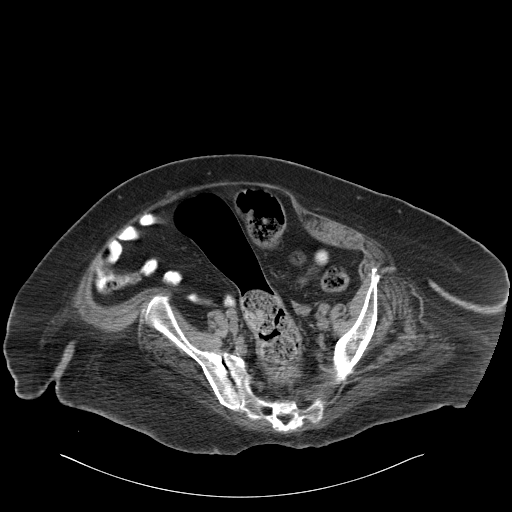
[im 41/99  soft-tissue]
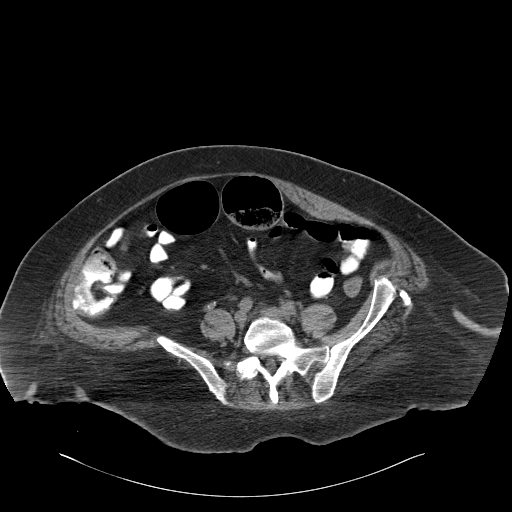
[im 45/99  soft-tissue]
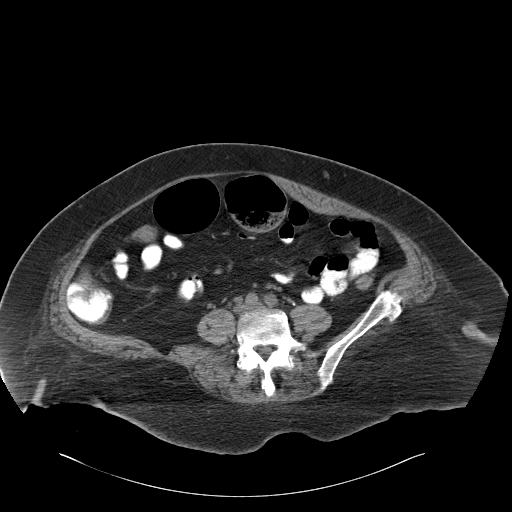
[im 54/99  soft-tissue]
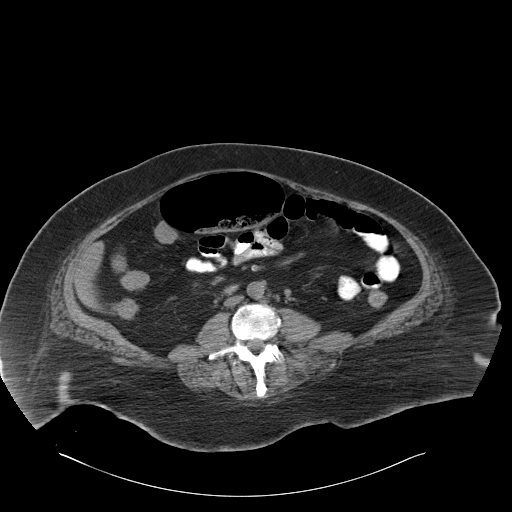
[im 58/99  soft-tissue]
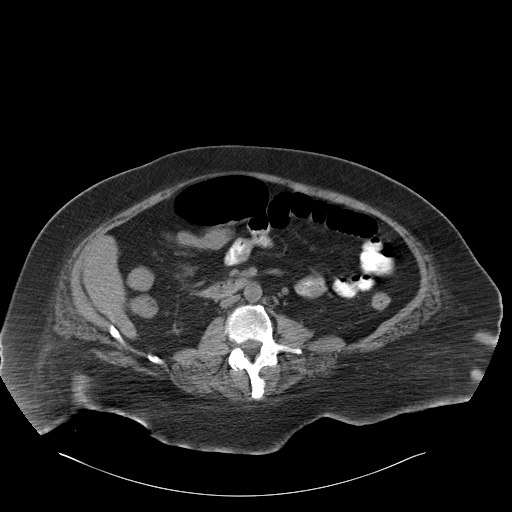
[im 58/99  bone]
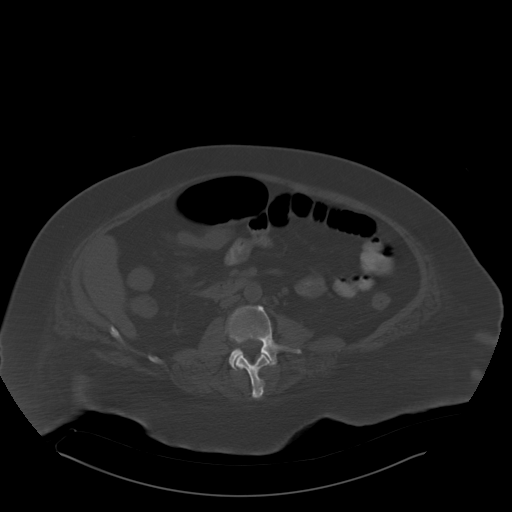
[im 66/99  soft-tissue]
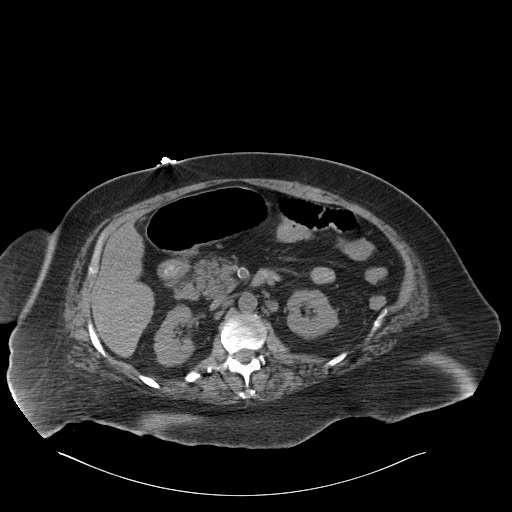
[im 74/99  soft-tissue]
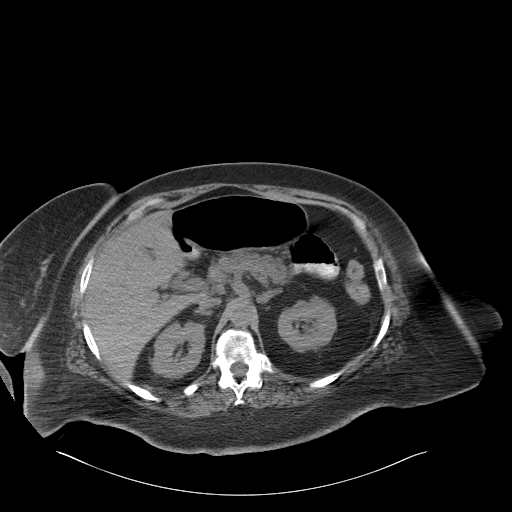
[im 78/99  soft-tissue]
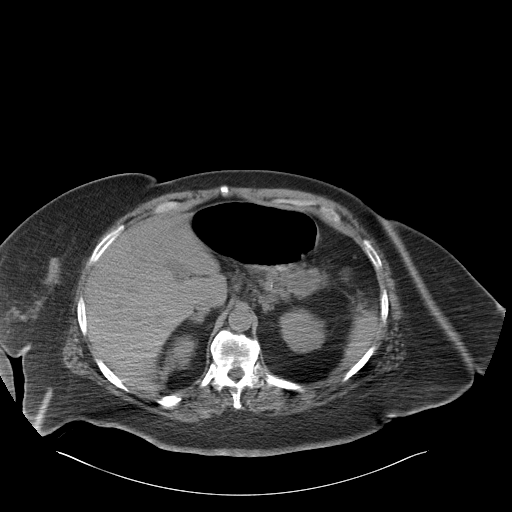
[im 86/99  soft-tissue]
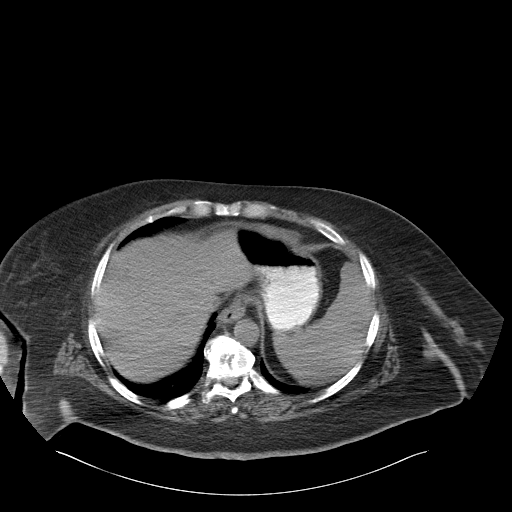
[im 94/99  soft-tissue]
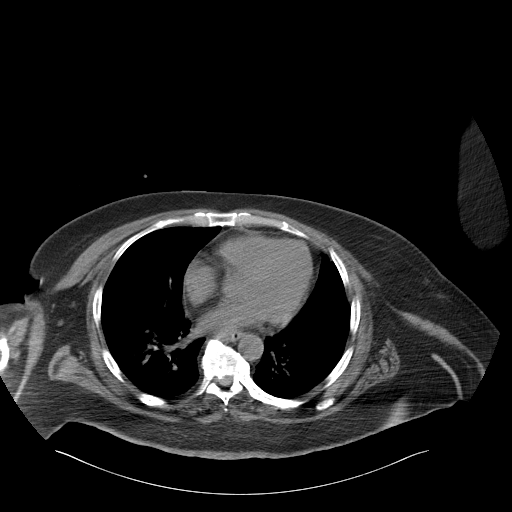

[Series 5: cor routine abd pel wo · coronal · 0.96mm/px · 3 of 159 slices shown]
[im 53/159  soft-tissue]
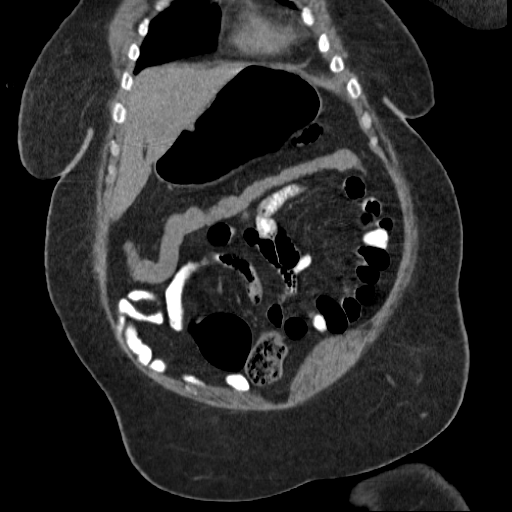
[im 71/159  soft-tissue]
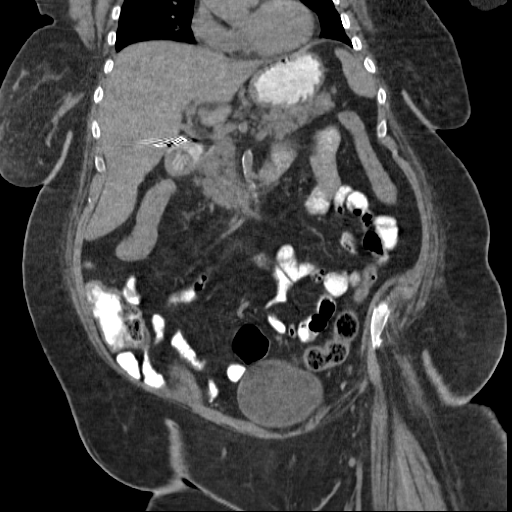
[im 88/159  soft-tissue]
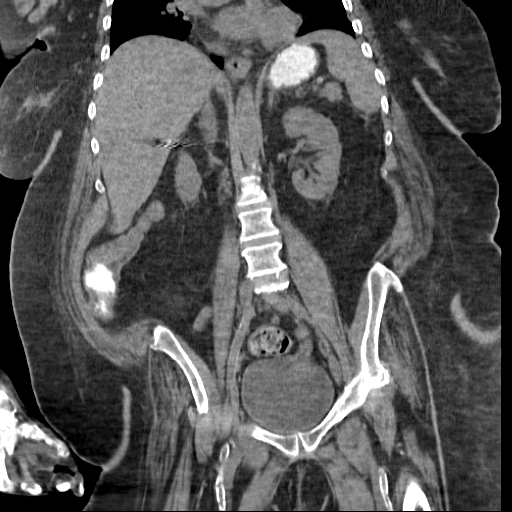

[17 of 46 positions shown; findings below may reference images not displayed]

FINDINGS: Large body habitus. Mild motion artifact at the lung bases. No
pericardial or pleural effusion. Mild lower lobe atelectasis.

Degenerative changes in the spine. No acute osseous abnormality
identified.

Widespread visible lower extremity calcified atherosclerosis. Some
medium vessel calcified plaque also noted in the abdomen (SMA).

No pelvic free fluid. Stool ball in the rectum. Trace gas within the
bladder. Uterus surgically absent. Negative adnexal.

Redundant sigmoid colon containing gas and fluid extending into the
epigastric region. No sigmoid wall thickening. Redundant but
decompressed left colon. Negative transverse colon. Redundant but
decompressed hepatic flexure. Negative cecum. No dilated small
bowel. Oral contrast has reached the cecum. Negative stomach and
duodenum.

Surgically absent gallbladder. Negative non contrast liver, spleen,
adrenal glands, and kidneys. Decompressed ureters. No abdominal free
fluid.

Negative non contrast CT appearance of the pancreas. No
pneumoperitoneum. No focal inflammatory stranding identified in the
abdomen.
IMPRESSION: 1. No CT changes of acute pancreatitis. No free fluid or focal
inflammation in the abdomen or pelvis.
2. Stool ball in the rectum. Redundant distal colon probably
reflects a degree of colonic inertia.

## 2018-07-16 ENCOUNTER — Non-Acute Institutional Stay: Payer: Medicare Other | Admitting: Nurse Practitioner

## 2018-07-16 ENCOUNTER — Encounter: Payer: Self-pay | Admitting: Nurse Practitioner

## 2018-07-16 VITALS — BP 122/64 | HR 78 | Temp 97.5°F | Resp 18 | Ht 62.0 in | Wt 226.5 lb

## 2018-07-16 DIAGNOSIS — Z515 Encounter for palliative care: Secondary | ICD-10-CM | POA: Insufficient documentation

## 2018-07-16 DIAGNOSIS — R1310 Dysphagia, unspecified: Secondary | ICD-10-CM | POA: Insufficient documentation

## 2018-07-16 DIAGNOSIS — G894 Chronic pain syndrome: Secondary | ICD-10-CM | POA: Insufficient documentation

## 2018-07-16 NOTE — Progress Notes (Signed)
[facility] Consult Note Telephone: [phone]  Fax: [phone]  PATIENT NAME: Jacqueline Shelton DOB: 05-Jan-1961 MRN: [id number]  PRIMARY CARE PROVIDER:   Dr Margarita Rana PROVIDER:  Dr [facility] RESPONSIBLE PARTY:   Self; Norelle Runnion [phone]   RECOMMENDATIONS and PLAN:  1. Palliative care encounter Z51.5; Palliative medicine team will continue to support patient, patient's family, and medical team. Visit consisted of counseling and education dealing with the complex and emotionally intense issues of symptom management and palliative care in the setting of serious and potentially life-threatening illness  2. Pain G89.29 secondary to will continue to monitor on pain scale, monitor efficacy vs adverse side effects. Continue with current pain regimen with Tramadol, gabapentin  3. Dysphagia R13.10; secondary to late onset CVA . Continue to monitor weights, appetite, aspiration precautions.   ASSESSMENT:     Late onset CVA, hemiplegia, hemiparesis, dysphasia, aphasia, congestive heart failure, diabetes, hypertension, hyperlipidemia, hypothyroidism, GERD, bilateral cataracts, depression, anxiety. She resides at skilled [place]. 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 She is incontinent bowel and bladder. She does require a lift and she is able to sit up in a wheelchair when she wishes. Staff endorses she continues to choose to remain in bed. She is able to feed herself with limited mobility with her left arm. Appetite is good. She does remain on a diabetic diet regular texture, regular liquid consistency. BMI 41.4. She does take schedule Tramadol for chronic pain in addition to Gabapentin. She is on Eliquis for secondary stroke prevention. She also takes Amitiza with Miralax for bowel regimen. She is able to verbalize needs. She is a DNR. At present she is lying in bed.  She appears comfortable. Her two aunts are currently visiting her.  I spent 60 minutes providing this consultation,  from 12:00pm to 1:00pm. More than 50% of the time in this consultation was spent coordinating communication.   HISTORY OF PRESENT ILLNESS:  Jacqueline Shelton is a 58 y.o. year old female with multiple medical problems including I visited and observe [name]. We talked about purpose for palliative care visit and she was in agreement. We talked about how she was feeling today. We talked about how her day was. We talked about symptoms of pain and shortness of breath what she denies. We talked about her appetite. No further difficulty with swallowing. Weight has been stable. We talked about getting out of bed and mobility. She declined politely. We talked at length about the importance of mobility and socialization. She shook her head no. She said she was socializing with her aunts. She has a lot of visitors at do come to see her. We talked about residing at the facility. We talked about medical goals of care which continue to focus on Comfort. DNR does remain in place. She does make her own decisions. Talked about role of palliative care and plan of care. No further changes at present time will continue to Monitor and follow. She does continue to remain stable. I have updated staff no new changes to current goals or plan of care. Palliative Care was asked to help address goals of care.   CODE STATUS: DNR PPS: 30% HOSPICE ELIGIBILITY/DIAGNOSIS: TBD  PAST MEDICAL HISTORY: History reviewed. No pertinent past medical history.  SOCIAL HX:  Social History   Tobacco Use  . Smoking status: Not on file  Substance Use Topics  . Alcohol use: Not  on file    ALLERGIES: No Known Allergies   PERTINENT MEDICATIONS:  No outpatient encounter medications on file as of 07/16/2018.   No facility-administered encounter medications on file as of 07/16/2018.     PHYSICAL EXAM:   General: NAD, obese,  debilitated female, pleasant Cardiovascular: regular rate and rhythm Pulmonary: clear ant fields Abdomen: soft, nontender, + bowel sounds GU: no suprapubic tenderness Extremities: no edema, no joint deformities Skin: no rashes Neurological: functional quadriplegic  Ida Milbrath Ihor Gully, NP

## 2018-10-24 ENCOUNTER — Non-Acute Institutional Stay: Payer: Medicare Other | Admitting: Nurse Practitioner

## 2018-10-24 ENCOUNTER — Encounter: Payer: Self-pay | Admitting: Nurse Practitioner

## 2018-10-24 ENCOUNTER — Other Ambulatory Visit: Payer: Self-pay

## 2018-10-24 VITALS — BP 118/56 | HR 78 | Temp 97.0°F | Resp 18 | Ht 62.0 in | Wt 220.0 lb

## 2018-10-24 DIAGNOSIS — Z515 Encounter for palliative care: Secondary | ICD-10-CM

## 2018-10-24 DIAGNOSIS — G894 Chronic pain syndrome: Secondary | ICD-10-CM

## 2018-10-24 DIAGNOSIS — R1310 Dysphagia, unspecified: Secondary | ICD-10-CM

## 2018-10-24 NOTE — Progress Notes (Signed)
[facility] Consult Note Telephone: [phone]  Fax: [phone]  PATIENT NAME: Jacqueline Shelton DOB: [birth year] MRN: [id number]  PRIMARY CARE PROVIDER:   Dr Margarita Rana PROVIDER:  Dr [facility] RESPONSIBLE PARTY:   Self: Jacqueline Shelton daughter [phone] or [phone]  Screening for COVID with negative screening  RECOMMENDATIONS and PLAN:  1. Palliative care encounter Z51.5; Palliative medicine team will continue to support patient, patient's family, and medical team. Visit consisted of counseling and education dealing with the complex and emotionally intense issues of symptom management and palliative care in the setting of serious and potentially life-threatening illness  2. Pain G89.29 secondary to will continue to monitor on pain scale, monitor efficacy vs adverse side effects. Continue with current pain regimen with Tramadol, gabapentin  3. Dysphagia R13.10; secondary to late onset CVA . Continue to monitor weights, appetite, aspiration precautions.   ASSESSMENT:    I visited and observed Jacqueline Shelton. We talked about purpose for palliative care visit and she was in agreement. We talked about how she was feeling today. She sure that she is doing fine she has no concerns or complaints. We talked about symptoms of pain and shortness of breath what she denies. We talked about poor appetite which has been good. We talked about her food choices and that she is tolerating what she's eating. She is not getting choked or coughing during eating episodes. She talked about the foods that she likes. We revisited getting out of bed which she declines politely. We talked about the importance of being mobile and functioning more independent as she has been able to in the past. She shared that she is happy in her bed. We talked about activities that she likes and they bring to her in her room. We talked about her nails as  they hopefully will get painted today. She talked about the color that she wanted to have them done. We talked about role of palliative care and plan of care. Medical goals to continue to remain focused on Jacqueline Shelton with DNR and place. She is her own responsible party. I updated nursing staff in any changes to current goals or plan of care. I have attempted to contact her daughter Jacqueline Shelton for update on palliative care visit  I spent 45 minutes providing this consultation,  from 9:00am to 9:45am. More than 50% of the time in this consultation was spent coordinating communication.   HISTORY OF PRESENT ILLNESS:  ALETHA ALLEBACH is a 58 y.o. year old female with multiple medical problems including Late onset CVA, hemiplegia, hemiparesis, dysphasia, aphasia, congestive heart failure, diabetes, hypertension, hyperlipidemia, hypothyroidism, GERD, bilateral cataracts, depression, anxiety. Jacqueline Appelhans continues to reside at [place] [facility]. 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 She does require a Hoyer lift for transfers to the wheelchair. She does have an electric wheelchair though has not been out of bed in months. She declines to get up. She is total abl dependence and does require positioning with pillows. She is incontinent bowel and bladder. She is able to feed herself with her left arm with limited mobility. She does eat what she wants to eat. Appetite has been good. No recent Falls, wounds, infections, hospitalizations. She has been followed by palliative care for medical goals to focus on Comfort, DNR it remains in place. She is her own responsible party. She is followed by Podiatry and  Psychotherapy. At present she is lying in bed. She appears comfortable though debilitated. She denies any concerns or complaint. No visitors present. Palliative Care was asked to help address goals of care.   CODE  STATUS: DNR  PPS: 30% HOSPICE ELIGIBILITY/DIAGNOSIS: TBD  PAST MEDICAL HISTORY: No past medical history on file.  SOCIAL HX:  Social History   Tobacco Use   Smoking status: Not on file  Substance Use Topics   Alcohol use: Not on file    ALLERGIES: No Known Allergies   PERTINENT MEDICATIONS:  No outpatient encounter medications on file as of 58/27/2020.   No facility-administered encounter medications on file as of 58/27/2020.     PHYSICAL EXAM:   General: NAD, Debilitated, pleasant female Cardiovascular: regular rate and rhythm Pulmonary: clear ant fields Abdomen: soft, nontender, + bowel sounds GU: no suprapubic tenderness Extremities: +BLE edema; contracted right upper extremity Skin: no rashes Neurological: Weakness but otherwise nonfocal/hemiplegic; functional quadriplegic  Hyde Sires Ihor Gully, NP

## 2018-11-07 ENCOUNTER — Encounter: Payer: Self-pay | Admitting: Nurse Practitioner

## 2018-11-07 ENCOUNTER — Non-Acute Institutional Stay: Payer: Medicare Other | Admitting: Nurse Practitioner

## 2018-11-07 VITALS — BP 123/81 | HR 88 | Temp 97.7°F | Resp 18 | Ht 62.0 in | Wt 219.1 lb

## 2018-11-07 DIAGNOSIS — Z515 Encounter for palliative care: Secondary | ICD-10-CM

## 2018-11-07 DIAGNOSIS — G894 Chronic pain syndrome: Secondary | ICD-10-CM

## 2018-11-07 DIAGNOSIS — R1310 Dysphagia, unspecified: Secondary | ICD-10-CM

## 2018-11-07 NOTE — Progress Notes (Signed)
[facility] Consult Note Telephone: [phone]  Fax: [phone]  PATIENT NAME: Jacqueline Shelton DOB: [birth year] MRN: [id number]  PRIMARY CARE PROVIDER:   Dr Margarita Rana PROVIDER:  Dr [facility] RESPONSIBLE PARTY:   Oni Dietzman daughter [phone] or [phone]  Recommendations: 1. Palliative care encounter Z51.5; Palliative medicine team will continue to support patient, patient's family, and medical team. Visit consisted of counseling and education dealing with the complex and emotionally intense issues of symptom management and palliative care in the setting of serious and potentially life-threatening illness  2. Pain G89.29 secondary to will continue to monitor on pain scale, monitor efficacy vs adverse side effects. Continue with current pain regimen with Tramadol, gabapentin  3. Dysphagia R13.10; secondary to late onset CVA . Continue to monitor weights, appetite, aspiration precautions.   ASSESSMENT:      5 / 1 / 2020 covid-19 negative  I visited and observed Jacqueline Shelton. We talked about purpose of follow up palliative care visit and she was in agreement. We talked about how she was feeling today. She verbalize that she was upset that her Doctor at the facility was changing. We talked about residing in the facility, loss of Independence and grieving. We talked about coping strategies. we talked about symptoms of pain and shortness of breath when she denies. We talked about her appetite. We talked about her functional limitations and continue to encourage her to be more independent, hoyer her electric wheelchair. She shared her wheelchair does not work. Asked if she would be willing to get up and she politely said no. We talked about the importance of interaction and mobility. We talked about difficulty with no visitor policy at facility currently due to covid-19. We talked about medical goals which  remain unchanged. Wishes are to treat what is treatable, DNR, and hospitalization if necessary. We talked about role of palliative care and plan of care. No new changes at present time as Jacqueline Shelton continues to remain stable. Will follow up in two months if needed or sooner should she declined. Jacqueline Shelton in agreement. Therapeutic listening and emotional support provided. Questions answered satisfaction. I have did a nursing staff in the new changes to current goals or plan of care.  I spent 45 minutes providing this consultation,  from 12:00pm to 12:45pm. More than 50% of the time in this consultation was spent coordinating communication.   HISTORY OF PRESENT ILLNESS:  Jacqueline Shelton is a 58 y.o. year old female with multiple medical problems including Late onset CVA, hemiplegia, hemiparesis, dysphasia, aphasia, congestive heart failure, diabetes, hypertension, hyperlipidemia, hypothyroidism, GERD, bilateral cataracts, depression, anxiety. She resides at [facility]. 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 She is incontinent bowel and bladderMs Shelton continues to reside at skilled [place] at [facility]. She is totally bed dependent and chooses not to get up. She previously was agreeable to be hoyered to a Clinical research associate. At that time she was able to maneuver a wheelchair but has since chosen not to get out of bed. She is total ADL dependence with incontinence. She does feed herself but requires tray setup. She is unable to move her legs and right arm. She does feed herself with her left arm. Appetite has been Fair. No recent Falls, wounds, infections, hospitalizations. She does remain a DNR with medical goals continue to focus on comfort. Blood sugars have been stable  and she continues on 70-30 novolog. She is followed by Ophthalmology, podiatry in Psychotherapy at the facility. She does have Aphasia and  it is difficult to understand what she is saying at times. She does verbalize her needs. At present she is lying in bed, appears chronically ill, debilitated but comfortable. No visitors present. Palliative Care was asked to help to continue to address goals of care.   CODE STATUS: DNR  PPS: 30% HOSPICE ELIGIBILITY/DIAGNOSIS: TBD  PAST MEDICAL HISTORY:  Past Medical History:  Diagnosis Date   Anxiety    Cataract    CHF (congestive heart failure) (HCC)    CVA (cerebral vascular accident) ([place])    Depression    DM (diabetes mellitus) ([place])    Dysphagia    GERD (gastroesophageal reflux disease)    Hemiplegia (HCC)    HLD (hyperlipidemia)    HTN (hypertension)    Hypothyroidism     SOCIAL HX:  Social History   Tobacco Use   Smoking status: Not on file  Substance Use Topics   Alcohol use: Not on file    ALLERGIES: No Known Allergies   PERTINENT MEDICATIONS:  Outpatient Encounter Medications as of 11/07/2018  Medication Sig   acetaminophen (TYLENOL) 325 MG tablet Take 650 mg by mouth every 6 (six) hours as needed.   apixaban (ELIQUIS) 5 MG TABS tablet Take 5 mg by mouth 2 (two) times daily.   furosemide (LASIX) 20 MG tablet Take 20 mg by mouth daily.   gabapentin (NEURONTIN) 300 MG capsule Take 300 mg by mouth 3 (three) times daily.   insulin aspart protamine- aspart (NOVOLOG MIX 70/30) (70-30) 100 UNIT/ML injection Inject 30 Units into the skin 2 (two) times daily with a meal.   levothyroxine (SYNTHROID) 50 MCG tablet Take 50 mcg by mouth daily before breakfast.   lisinopril (ZESTRIL) 2.5 MG tablet Take 2.5 mg by mouth daily.   magnesium oxide (MAG-OX) 400 MG tablet Take 400 mg by mouth 2 (two) times daily.   menthol-cetylpyridinium (CEPACOL) 3 MG lozenge Take 1 lozenge by mouth as needed for sore throat.   metoprolol succinate (TOPROL-XL) 25 MG 24 hr tablet Take 25 mg by mouth daily.   omeprazole (PRILOSEC) 20 MG capsule Take 20 mg by mouth daily.    polyethylene glycol (MIRALAX / GLYCOLAX) 17 g packet Take 17 g by mouth daily.   pravastatin (PRAVACHOL) 20 MG tablet Take 20 mg by mouth at bedtime.   senna (SENOKOT) 8.6 MG TABS tablet Take 2 tablets by mouth at bedtime.   traMADol (ULTRAM) 50 MG tablet Take 100 mg by mouth 3 (three) times daily.   trolamine salicylate (ASPERCREME) 10 % cream Apply 1 application topically as needed for muscle pain.   No facility-administered encounter medications on file as of 11/07/2018.     PHYSICAL EXAM:   General: NAD,debilitated, chronically ill, aphasic female Cardiovascular: regular rate and rhythm Pulmonary: clear ant fields Abdomen: soft, nontender, + bowel sounds GU: no suprapubic tenderness Extremities: + edema, no joint deformities/right arm contracted; BLE edema; foot drop Skin: no rashes Neurological: functional quadriplegic; hemiplegia  Deston Bilyeu Ihor Gully, NP

## 2018-11-08 ENCOUNTER — Other Ambulatory Visit: Payer: Self-pay

## 2019-01-23 ENCOUNTER — Non-Acute Institutional Stay: Payer: Medicare Other | Admitting: Nurse Practitioner

## 2019-01-23 ENCOUNTER — Other Ambulatory Visit: Payer: Self-pay

## 2019-01-23 ENCOUNTER — Encounter: Payer: Self-pay | Admitting: Nurse Practitioner

## 2019-01-23 VITALS — BP 122/70 | HR 16 | Temp 98.2°F | Resp 18 | Wt 214.6 lb

## 2019-01-23 DIAGNOSIS — Z515 Encounter for palliative care: Secondary | ICD-10-CM

## 2019-01-23 NOTE — Progress Notes (Signed)
[facility] Consult Note Telephone: [phone]  Fax: [phone]  PATIENT NAME: Jacqueline Shelton DOB: July 11, 1960 MRN: [id number]  PRIMARY CARE PROVIDER:   Dr Yves Dill PROVIDER:  Dr Hodges/[facility]  RESPONSIBLE PARTY:    Self; Alianny Toelle [phone]  RECOMMENDATIONS and PLAN:  1. ACP: DNR; Wishes are to treat what is treatable, avoid hospitalization but will go if necessary.   2. Pain  secondary to will continue to monitor on pain scale, monitor efficacy vs adverse side effects. Continue with current pain regimen with Tramadol, gabapentin  3. Dysphagia; secondary to late onset CVA . Continue to monitor weights, appetite, aspiration precautions.   4. Palliative care encounter/ Palliative medicine team will continue to support patient, patient's family, and medical team. Visit consisted of counseling and education dealing with the complex and emotionally intense issues of symptom management and palliative care in the setting of serious and potentially life-threatening illness  I spent 45 minutes providing this consultation,  from 10:45am to 11:30am. More than 50% of the time in this consultation was spent coordinating communication.   HISTORY OF PRESENT ILLNESS:  Jacqueline Shelton is a 58 y.o. year old female with multiple medical problems including Late onset CVA, hemiplegia, hemiparesis, dysphasia, aphasia, congestive heart failure, diabetes, hypertension, hyperlipidemia, hypothyroidism, GERD, bilateral cataracts, depression, anxiety.Jacqueline Shelton continues to reside in [place] at [facility]. She remains bed-bound as per her choice. She is totally adl dependent with incontinence bowel and bladder. She does feed herself after tray setup. No recent Falls, wounds, hospitalizations. Last primary provider visit 8 / 17 / 2020 for a positive covid-19 done on 8/14 / 2020,  asymptomatic when the test was repeated one week later she was negative and remains asymptomatic. She is oriented and able to verbalize her needs. She is her own responsible party. Staff endorses no new concerns as she has been stable. At present Jacqueline Shelton is lying in bed. She appears debilitative but comfortable. No visitors present. I visited and observed Jacqueline Shelton. We talked about purpose and palliative care visit and Jacqueline Shelton and agreement. We talked about how she was feeling today and she shared that she's doing okay. She this is her daughter's visiting. We talked about the visitation at the window. We talked about symptoms of pain and shortness of breath but she denies. We talked about her appetite and she shared that it depends on what food is being served as if to how much she'll eat of it. We talked about the importance of mobility. She continues to decline to get out of bed. We talked about the importance of social interaction. Activities to go into her room with independent activities. We talked about medical goals with focus on treat what is treatable, avoid hospitalization although would go to the hospital if necessary, DNR in place. We talked about role of palliative care and plan of care. Jacqueline Shelton she is very thankful for palliative care visit and support. She has Aphasia so it does take some time to understand what she's trying to communicate. We talked about the elephant painting behind her bed and asked if she would like that move to where she can see it. She verbalize know she wanted it behind her head. We talked about pictures in her room and collage that is on her wall. Therapeutic listening and emotional support provided. She does continue to be stable and will continue to encourage  mobility. Questions answered dissatisfaction. I have updated nursing staff and any changes to current goals or plan of care.  Palliative Care was asked to help to continue to address goals of care.    7 / 30 / 2,020 WBC 6.6, hemoglobin 11.3, hematocrit 34.2, platelets 250, sodium 140, potassium 4.7, chloride 103, Co2 27, calcium 9.0, b 23.2, creatinine 1.57, glucose 164, total protein 6.2, albumin 3.6, hemoglobin A1c 6.6  CODE STATUS: DNR  PPS: 30% HOSPICE ELIGIBILITY/DIAGNOSIS: TBD  PAST MEDICAL HISTORY:  Past Medical History:  Diagnosis Date  . Anxiety   . Cataract   . CHF (congestive heart failure) ([place])   . CVA (cerebral vascular accident) ([place])   . Depression   . DM (diabetes mellitus) ([place])   . Dysphagia   . GERD (gastroesophageal reflux disease)   . Hemiplegia ([place])   . HLD (hyperlipidemia)   . HTN (hypertension)   . Hypothyroidism     SOCIAL HX:  Social History   Tobacco Use  . Smoking status: Not on file  Substance Use Topics  . Alcohol use: Not on file    ALLERGIES: No Known Allergies   PERTINENT MEDICATIONS:  Outpatient Encounter Medications as of 01/23/2019  Medication Sig  . acetaminophen (TYLENOL) 325 MG tablet Take 650 mg by mouth every 6 (six) hours as needed.  Marland Kitchen apixaban (ELIQUIS) 5 MG TABS tablet Take 5 mg by mouth 2 (two) times daily.  . furosemide (LASIX) 20 MG tablet Take 20 mg by mouth daily.  Marland Kitchen gabapentin (NEURONTIN) 300 MG capsule Take 300 mg by mouth 3 (three) times daily.  . insulin aspart protamine- aspart (NOVOLOG MIX 70/30) (70-30) 100 UNIT/ML injection Inject 30 Units into the skin 2 (two) times daily with a meal.  . levothyroxine (SYNTHROID) 50 MCG tablet Take 50 mcg by mouth daily before breakfast.  . lisinopril (ZESTRIL) 2.5 MG tablet Take 2.5 mg by mouth daily.  . magnesium oxide (MAG-OX) 400 MG tablet Take 400 mg by mouth 2 (two) times daily.  Marland Kitchen menthol-cetylpyridinium (CEPACOL) 3 MG lozenge Take 1 lozenge by mouth as needed for sore throat.  . metoprolol succinate (TOPROL-XL) 25 MG 24 hr tablet Take 25 mg by mouth daily.  Marland Kitchen omeprazole (PRILOSEC) 20 MG capsule Take 20 mg by mouth daily.  . polyethylene glycol (MIRALAX / GLYCOLAX) 17  g packet Take 17 g by mouth daily.  . pravastatin (PRAVACHOL) 20 MG tablet Take 20 mg by mouth at bedtime.  . senna (SENOKOT) 8.6 MG TABS tablet Take 2 tablets by mouth at bedtime.  . traMADol (ULTRAM) 50 MG tablet Take 100 mg by mouth 3 (three) times daily.  Marland Kitchen trolamine salicylate (ASPERCREME) 10 % cream Apply 1 application topically as needed for muscle pain.   No facility-administered encounter medications on file as of 01/23/2019.     PHYSICAL EXAM:   General: NAD, debilitated, aphasic female Cardiovascular: regular rate and rhythm Pulmonary: clear ant fields Abdomen: soft, nontender, + bowel sounds Extremities: + edema, no joint deformities Neurological: Weakness but otherwise nonfocal/functional quadriplegic/hemiplegia  Kynsli Haapala Ihor Gully, NP

## 2019-04-23 ENCOUNTER — Non-Acute Institutional Stay: Payer: Medicare Other | Admitting: Nurse Practitioner

## 2019-04-23 ENCOUNTER — Encounter: Payer: Self-pay | Admitting: Nurse Practitioner

## 2019-04-23 DIAGNOSIS — I639 Cerebral infarction, unspecified: Secondary | ICD-10-CM

## 2019-04-23 DIAGNOSIS — Z515 Encounter for palliative care: Secondary | ICD-10-CM

## 2019-04-23 NOTE — Progress Notes (Signed)
Therapist, nutritional Palliative Care Consult Note Telephone: [phone]  Fax: [phone]  PATIENT NAME: Jacqueline Shelton DOB: [birth year] MRN: [id number]    PRIMARY CARE PROVIDER:   Dr Kathleen Lime PROVIDER:  Dr Hodges/[facility] RESPONSIBLE PARTY:   Lavell Ridings daughter [phone] or [phone]  Recommendations: 1.ACP: DNR; Treat what is treatable;   2.Painsecondary to will continue to monitor on pain scale, monitor efficacy vs adverse side effects. Continue with current pain regimen withTramadol, gabapentin  3.Dysphagia ; secondary tolate onset CVA. Continue to monitor weights, appetite, aspiration precautions.    4. Palliative care encounter; Palliative medicine team will continue to support patient, patient's family, and medical team. Visit consisted of counseling and education dealing with the complex and emotionally intense issues of symptom management and palliative care in the setting of serious and potentially life-threatening illness  I spent 35 minutes providing this consultation,  from 4:45 to 5:20pm. More than 50% of the time in this consultation was spent coordinating communication.   HISTORY OF PRESENT ILLNESS:  Jacqueline Shelton is a 58 y.o. year old female with multiple medical problems including .Late onset CVA, hemiplegia, hemiparesis, dysphasia, aphasia, congestive heart failure, diabetes, hypertension, hyperlipidemia, hypothyroidism, GERD, bilateral cataracts, depression, anxiety. Jacqueline Shelton continues to reside in [facility]. She remains bed down, total ADL dependents with incontinence due to late onset CVA. She has aphasia but is able to verbalize her needs. She recently was covid positive. Jacqueline. Shelton completed her quarantine time, remains asymptomatic. At present she is lying in bed. She appears debilitated, comfortable. No visitors present. I  visited and observe Jacqueline Colledge. We talked about purpose for palliative care visit, Jacqueline. Shelton in agreement. We talked about how Jacqueline. Shelton has been feeling. Jacqueline. Shelton nodded her head replying she is doing okay. We talked about symptoms which she denies. We talked about her new room that she has been moved to two weeks ago. We talked about the view out the window. We talked about having covid. We talked about her functional debility. We talked about the importance of getting up out of bed which she politely declined. We talked about her appetite which has improved. We talked about residing at Skilled Nursing Facility, challenges with loss of Independence. Coping strategies discussed. Emotional support provided. We talked about medical goals to continue to treat what is treatable. DNR does remain in place. We talked about role of palliative care and plan of care. Discussed will follow up in 8 weeks if needed or sooner should she declined. Jacqueline Shelton in agreement. She has her own responsible party. I have attempted to contact her daughter for update, unable to leave a message. I updated nursing staff and any changes to current goals or plan of care.  Palliative Care was asked to help to continue to address goals of care.   CODE STATUS: DNR  PPS: 30% HOSPICE ELIGIBILITY/DIAGNOSIS: TBD  PAST MEDICAL HISTORY:  Past Medical History:  Diagnosis Date   Anxiety    Cataract    CHF (congestive heart failure) (HCC)    CVA (cerebral vascular accident) (HCC)    Depression    DM (diabetes mellitus) (HCC)    Dysphagia    GERD (gastroesophageal reflux disease)    Hemiplegia (HCC)    HLD (hyperlipidemia)    HTN (hypertension)    Hypothyroidism     SOCIAL HX:  Social History   Tobacco Use   Smoking  status: Not on file  Substance Use Topics   Alcohol use: Not on file    ALLERGIES: No Known Allergies   PERTINENT MEDICATIONS:  Outpatient Encounter Medications as of 04/23/2019    Medication Sig   acetaminophen (TYLENOL) 325 MG tablet Take 650 mg by mouth every 6 (six) hours as needed.   apixaban (ELIQUIS) 5 MG TABS tablet Take 5 mg by mouth 2 (two) times daily.   furosemide (LASIX) 20 MG tablet Take 20 mg by mouth daily.   gabapentin (NEURONTIN) 300 MG capsule Take 300 mg by mouth 3 (three) times daily.   insulin aspart protamine- aspart (NOVOLOG MIX 70/30) (70-30) 100 UNIT/ML injection Inject 30 Units into the skin 2 (two) times daily with a meal.   levothyroxine (SYNTHROID) 50 MCG tablet Take 50 mcg by mouth daily before breakfast.   lisinopril (ZESTRIL) 2.5 MG tablet Take 2.5 mg by mouth daily.   magnesium oxide (MAG-OX) 400 MG tablet Take 400 mg by mouth 2 (two) times daily.   menthol-cetylpyridinium (CEPACOL) 3 MG lozenge Take 1 lozenge by mouth as needed for sore throat.   metoprolol succinate (TOPROL-XL) 25 MG 24 hr tablet Take 25 mg by mouth daily.   omeprazole (PRILOSEC) 20 MG capsule Take 20 mg by mouth daily.   polyethylene glycol (MIRALAX / GLYCOLAX) 17 g packet Take 17 g by mouth daily.   pravastatin (PRAVACHOL) 20 MG tablet Take 20 mg by mouth at bedtime.   senna (SENOKOT) 8.6 MG TABS tablet Take 2 tablets by mouth at bedtime.   traMADol (ULTRAM) 50 MG tablet Take 100 mg by mouth 3 (three) times daily.   trolamine salicylate (ASPERCREME) 10 % cream Apply 1 application topically as needed for muscle pain.   No facility-administered encounter medications on file as of 04/23/2019.     PHYSICAL EXAM:   General: NAD, debilitated, aphasic, pleasant female Cardiovascular: regular rate and rhythm Pulmonary: clear ant fields Abdomen: soft, nontender, + bowel sounds Extremities: + edema, no joint deformities Neurological: contracted/hemiplegia  Jacqueline Shelton Jacqueline Gully, NP

## 2019-04-23 NOTE — Progress Notes (Deleted)
See below

## 2019-04-24 ENCOUNTER — Other Ambulatory Visit: Payer: Self-pay

## 2019-05-08 ENCOUNTER — Non-Acute Institutional Stay: Payer: Medicare Other | Admitting: Nurse Practitioner

## 2019-05-08 ENCOUNTER — Encounter: Payer: Self-pay | Admitting: Nurse Practitioner

## 2019-05-08 VITALS — BP 104/54 | HR 82 | Temp 98.0°F | Resp 18 | Wt 215.9 lb

## 2019-05-08 DIAGNOSIS — Z515 Encounter for palliative care: Secondary | ICD-10-CM

## 2019-05-08 DIAGNOSIS — I639 Cerebral infarction, unspecified: Secondary | ICD-10-CM

## 2019-05-08 NOTE — Progress Notes (Signed)
Therapist, nutritional Palliative Care Consult Note Telephone: [phone]  Fax: [phone]  PATIENT NAME: Jacqueline Shelton DOB: August 09, 1960 MRN: [id number]  PRIMARY CARE PROVIDER:Dr Kathleen Lime PROVIDER:Dr Hodges/[facility] RESPONSIBLE PARTY: Rheagan Nayak daughter [phone] or [phone]  Recommendations: 1.ACP: DNR; Treat what is treatable;   2.Painsecondary to will continue to monitor on pain scale, monitor efficacy vs adverse side effects. Continue with current pain regimen withTramadol, gabapentin  3.Dysphagia ; secondary tolate onset CVA. Continue to monitor weights, appetite, aspiration precautions.   4. Palliative care encounter; Palliative medicine team will continue to support patient, patient's family, and medical team. Visit consisted of counseling and education dealing with the complex and emotionally intense issues of symptom management and palliative care in the setting of serious and potentially life-threatening illness  I spent 45 minutes providing this consultation,  From 2:00pm to 2:45pm. More than 50% of the time in this consultation was spent coordinating communication.   HISTORY OF PRESENT ILLNESS:  Jacqueline Shelton is a 58 y.o. year old female with multiple medical problems including Late onset CVA, hemiplegia, hemiparesis, dysphasia, aphasia, congestive heart failure, diabetes, hypertension, hyperlipidemia, hypothyroidism, GERD, bilateral cataracts, depression, anxiety. Jacqueline Shelton continues to reside in [facility]. She remains bedbound, requires a hoyer lift to a chair. Jacqueline Shelton refuses to get up for staff. She has recently been relocated to a new room, new roommate. Jacqueline. Shelton is total ADL dependent and requires staff to turn in position her. Jacqueline. Shelton is incontinent bowel and bladder. Jacqueline. Shelton does feed herself though it takes a  while and she does require tray setup. Jacqueline Shelton has a small red open wound buttock. Last primary provider 11 / 17 / 2024 positive covid test 11 / 6 / 2020. Jacqueline Shelton is able to verbalize her knees bow it does take time do to her dysphasia. At present Jacqueline. Shelton is lying in bed. Jacqueline Shelton appears debilitated secondary to with hemiplegia and aphasia. No visitors present. I visited and observe Jacqueline Shelton. We talked about purpose of palliative care visit. Jacqueline. Shelton agreed. We talked about how she was feeling today. Jacqueline. Shelton shared she is okay. We talked about symptoms of pain or shortness of breath. We talked about the pain that she has chronically in her arm for what she does take Tramadol for. Jacqueline. Shelton does find relief with the scheduled Tramadol. We talked about her new room and roommate. We talked about quality of life. Jacqueline. Shelton was glad to be moved to this new room. We talked about all the things that she has accumulated there are many bags at the end of her bed waiting to be organized and put away. We talked about her love for Elephants. We talked about how our first Thanksgiving was. We talked about her appetite. We talked about her declining medications at different times for staff. We talked about social isolation and the importance of interacting with others. We talked about role of palliative care and plan of care. I have attempted to contact her daughter for update on palliative care visit but unsuccessful. I have updated Jacqueline Shelton. We talked about next palliative care follow-up visit in two months if needed or sooner should she declined. Jacqueline Shelton in agreement. I updated nursing staff no new goals to current plan of care.  Palliative Care was asked to help to continue to address goals of care.   CODE STATUS: DNR  PPS: 30% HOSPICE ELIGIBILITY/DIAGNOSIS: TBD  PAST MEDICAL HISTORY:  Past Medical History:  Diagnosis Date  . Anxiety   . Cataract   . CHF (congestive heart failure)  ([place])   . CVA (cerebral vascular accident) ([place])   . Depression   . DM (diabetes mellitus) ([place])   . Dysphagia   . GERD (gastroesophageal reflux disease)   . Hemiplegia ([place])   . HLD (hyperlipidemia)   . HTN (hypertension)   . Hypothyroidism     SOCIAL HX:  Social History   Tobacco Use  . Smoking status: Not on file  Substance Use Topics  . Alcohol use: Not on file    ALLERGIES: No Known Allergies   PERTINENT MEDICATIONS:  Outpatient Encounter Medications as of 05/08/2019  Medication Sig  . acetaminophen (TYLENOL) 325 MG tablet Take 650 mg by mouth every 6 (six) hours as needed.  Marland Kitchen apixaban (ELIQUIS) 5 MG TABS tablet Take 5 mg by mouth 2 (two) times daily.  . furosemide (LASIX) 20 MG tablet Take 20 mg by mouth daily.  Marland Kitchen gabapentin (NEURONTIN) 300 MG capsule Take 300 mg by mouth 3 (three) times daily.  . insulin aspart protamine- aspart (NOVOLOG MIX 70/30) (70-30) 100 UNIT/ML injection Inject 30 Units into the skin 2 (two) times daily with a meal.  . levothyroxine (SYNTHROID) 50 MCG tablet Take 50 mcg by mouth daily before breakfast.  . lisinopril (ZESTRIL) 2.5 MG tablet Take 2.5 mg by mouth daily.  . magnesium oxide (MAG-OX) 400 MG tablet Take 400 mg by mouth 2 (two) times daily.  Marland Kitchen menthol-cetylpyridinium (CEPACOL) 3 MG lozenge Take 1 lozenge by mouth as needed for sore throat.  . metoprolol succinate (TOPROL-XL) 25 MG 24 hr tablet Take 25 mg by mouth daily.  Marland Kitchen omeprazole (PRILOSEC) 20 MG capsule Take 20 mg by mouth daily.  . polyethylene glycol (MIRALAX / GLYCOLAX) 17 g packet Take 17 g by mouth daily.  . pravastatin (PRAVACHOL) 20 MG tablet Take 20 mg by mouth at bedtime.  . senna (SENOKOT) 8.6 MG TABS tablet Take 2 tablets by mouth at bedtime.  . traMADol (ULTRAM) 50 MG tablet Take 100 mg by mouth 3 (three) times daily.  Marland Kitchen trolamine salicylate (ASPERCREME) 10 % cream Apply 1 application topically as needed for muscle pain.   No facility-administered encounter medications  on file as of 05/08/2019.     PHYSICAL EXAM:   General: NAD, debilitated, chronically ill, pleasant female Cardiovascular: regular rate and rhythm Pulmonary: clear ant fields Abdomen: soft, nontender, + bowel sounds Extremities: +edema, contractures Neurological: functional quadriplegic Christin Ihor Gully, NP

## 2019-05-09 ENCOUNTER — Other Ambulatory Visit: Payer: Self-pay

## 2019-07-10 ENCOUNTER — Non-Acute Institutional Stay: Payer: Medicare Other | Admitting: Nurse Practitioner

## 2019-07-10 ENCOUNTER — Encounter: Payer: Self-pay | Admitting: Nurse Practitioner

## 2019-07-10 ENCOUNTER — Other Ambulatory Visit: Payer: Self-pay

## 2019-07-10 VITALS — BP 95/54 | HR 77 | Temp 98.0°F | Resp 18

## 2019-07-10 DIAGNOSIS — Z515 Encounter for palliative care: Secondary | ICD-10-CM

## 2019-07-10 DIAGNOSIS — I639 Cerebral infarction, unspecified: Secondary | ICD-10-CM

## 2019-07-10 NOTE — Progress Notes (Signed)
[place] Consult Note Telephone: [phone]  Fax: [phone]  PATIENT NAME: Jacqueline Shelton DOB: 05-09-1961 MRN: [id number]   PRIMARY CARE PROVIDER:DrHodges REFERRING PROVIDER:DrHodges/[facility] RESPONSIBLE PARTY: Glen Blatchley daughter [phone] or [phone]  Recommendations: 1.ACP: DNR; Treat what is treatable;  2.Painsecondary to will continue to monitor on pain scale, monitor efficacy vs adverse side effects. Continue with current pain regimen withTramadol, gabapentin  3.Dysphagia ; secondary tolate onset CVA. Continue to monitor weights, appetite, aspiration precautions.  4.Palliative care encounter; Palliative medicine team will continue to support patient, patient's family, and medical team. Visit consisted of counseling and education dealing with the complex and emotionally intense issues of symptom management and palliative care in the setting of serious and potentially life-threatening illness  I spent 35 minutes providing this consultation,  from 8:40am to 9:15am. More than 50% of the time in this consultation was spent coordinating communication.   HISTORY OF PRESENT ILLNESS:  [name] is a 59 y.o. year old female with multiple medical problems including Late onset CVA, hemiplegia, hemiparesis, dysphasia, aphasia, congestive heart failure, diabetes, hypertension, hyperlipidemia, hypothyroidism, GERD, bilateral cataracts, depression, anxiety.  Ms Mcclory continues to reside at [place]. Ms Horacek does remain severely debilitated secondary to CVA. Ms Cogan requires assistance with turning, positioning, propping with pillows. Ms Kuck is ADL dependent with incontinent bowel and bladder. Ms. Tenenbaum is able to feed herself with her left arm though it is challenging at times.  Ms Flaten does continue to eat  what she wants to eat as far as food texture. Ms Anselmo does get scheduled Tramadol for left arm pain with relief those staff endorses that time she will complain that it's bothering her. No recent falls, wounds, hospitalizations. Last primary provider note 1 / 25 / 2021 for comprehensive. Ms. Hoefer did test positive for covid 8 / 2020 reoccurred 11 / 2020 remained asymptomatic. Medical goals focus on wishes are for DNR, surgical intervention, lab testing, dehydration, hospitalization, feeding tube, diagnostic testing, blood transfusion, antibiotics. Staff endorses no new changes or concerns. At present Ms Arvanitis is lying in bed. Ms. Meadow appears to be located but comfortable. No visitors present. I visited him serve Ms Ketterman. We talked about purpose of palliative care visit and Ms Ake in agreement. We talked about symptoms of pain for which Ms Bloxom endorses her left arm has been hurting. We talked about the option of physical therapy for pain modality. Ms Zubiate an agreement and will update after nurse practitioner. We talked about symptoms of shortness of breath when she denies. We talked a better appetite which is good. Ms Iversen endorses she not having any trouble swallowing and eats what she wants to eat. We talked about her new room and view that she has. We talked about the challenges involved in no visitors and covid-19 pandemic. We talked about the challenges of loss of Independence. Coping strategies discussed. Emotional support provided. We talked about medical goals of care with focus on comfort. We talked about role of palliative care and plan of care. Discuss will follow up in one month and see how physical therapy help with pain modality. Ms Lopata in agreement. I have attempted to contact her daughter for update on palliative care visit. No new changes to current goals are playing with care. I updated nursing staff. Palliative Care was asked to help to continue to address goals of  care.  CODE STATUS: DNR  PPS: 30% HOSPICE ELIGIBILITY/DIAGNOSIS: TBD  PAST MEDICAL HISTORY:  Past Medical History:  Diagnosis Date  . Anxiety   . Cataract   . CHF (congestive heart failure) ([place])   . CVA (cerebral vascular accident) ([place])   . Depression   . DM (diabetes mellitus) ([place])   . Dysphagia   . GERD (gastroesophageal reflux disease)   . Hemiplegia ([place])   . HLD (hyperlipidemia)   . HTN (hypertension)   . Hypothyroidism     SOCIAL HX:  Social History   Tobacco Use  . Smoking status: Not on file  Substance Use Topics  . Alcohol use: Not on file    ALLERGIES: No Known Allergies   PERTINENT MEDICATIONS:  Outpatient Encounter Medications as of 07/10/2019  Medication Sig  . acetaminophen (TYLENOL) 325 MG tablet Take 650 mg by mouth every 6 (six) hours as needed.  Marland Kitchen apixaban (ELIQUIS) 5 MG TABS tablet Take 5 mg by mouth 2 (two) times daily.  . furosemide (LASIX) 20 MG tablet Take 20 mg by mouth daily.  Marland Kitchen gabapentin (NEURONTIN) 300 MG capsule Take 300 mg by mouth 3 (three) times daily.  . insulin aspart protamine- aspart (NOVOLOG MIX 70/30) (70-30) 100 UNIT/ML injection Inject 30 Units into the skin 2 (two) times daily with a meal.  . levothyroxine (SYNTHROID) 50 MCG tablet Take 50 mcg by mouth daily before breakfast.  . lisinopril (ZESTRIL) 2.5 MG tablet Take 2.5 mg by mouth daily.  . magnesium oxide (MAG-OX) 400 MG tablet Take 400 mg by mouth 2 (two) times daily.  Marland Kitchen menthol-cetylpyridinium (CEPACOL) 3 MG lozenge Take 1 lozenge by mouth as needed for sore throat.  . metoprolol succinate (TOPROL-XL) 25 MG 24 hr tablet Take 25 mg by mouth daily.  Marland Kitchen omeprazole (PRILOSEC) 20 MG capsule Take 20 mg by mouth daily.  . polyethylene glycol (MIRALAX / GLYCOLAX) 17 g packet Take 17 g by mouth daily.  . pravastatin (PRAVACHOL) 20 MG tablet Take 20 mg by mouth at bedtime.  . senna (SENOKOT) 8.6 MG TABS tablet Take 2 tablets by mouth at bedtime.  . traMADol (ULTRAM) 50 MG tablet  Take 100 mg by mouth 3 (three) times daily.  Marland Kitchen trolamine salicylate (ASPERCREME) 10 % cream Apply 1 application topically as needed for muscle pain.   No facility-administered encounter medications on file as of 07/10/2019.    PHYSICAL EXAM:   General: NAD, debilitated, aphasic chronically ill female Cardiovascular: regular rate and rhythm Pulmonary: clear ant fields Abdomen: soft, nontender, + bowel sounds Extremities: contracted, mild edema, no joint deformities Neurological: functional quadriplegic  Sael Furches Ihor Gully, NP

## 2019-08-14 ENCOUNTER — Non-Acute Institutional Stay: Payer: Medicare Other | Admitting: Nurse Practitioner

## 2019-08-14 ENCOUNTER — Other Ambulatory Visit: Payer: Self-pay

## 2019-08-14 VITALS — BP 110/70 | HR 82 | Temp 98.0°F | Resp 18 | Wt 210.9 lb

## 2019-08-14 DIAGNOSIS — Z515 Encounter for palliative care: Secondary | ICD-10-CM

## 2019-08-14 DIAGNOSIS — I639 Cerebral infarction, unspecified: Secondary | ICD-10-CM

## 2019-08-14 NOTE — Progress Notes (Signed)
[facility] Consult Note Telephone: [phone]  Fax: [phone]  PATIENT NAME: Jacqueline Shelton DOB: 1961/01/28 MRN: [id number]  PRIMARY CARE PROVIDER:DrHodges REFERRING PROVIDER:DrHodges/[facility] RESPONSIBLE PARTY: Colene M[place] daughter [phone] or [phone]  Recommendations: 1.ACP: DNR; Treat what is treatable;  2.Painsecondary to will continue to monitor on pain scale, monitor efficacy vs adverse side effects. Continue with current pain regimen withTramadol, gabapentin  3.Dysphagia ; secondary tolate onset CVA. Continue to monitor weights, appetite, aspiration precautions.  4.Palliative care encounter; Palliative medicine team will continue to support patient, patient's family, and medical team. Visit consisted of counseling and education dealing with the complex and emotionally intense issues of symptom management and palliative care in the setting of serious and potentially life-threatening illness I spent 35 minutes providing this consultation,  from 10:10am to 10:45am. More than 50% of the time in this consultation was spent coordinating communication.   HISTORY OF PRESENT ILLNESS:  Jacqueline Shelton is a 59 y.o. year old female with multiple medical problems including Late onset CVA, hemiplegia, hemiparesis, dysphasia, aphasia, congestive heart failure, diabetes, hypertension, hyperlipidemia, hypothyroidism, GERD, bilateral cataracts, depression, anxiety. Jacqueline. Shelton continues to reside [place] at [facility]. Jacqueline Shelton does remain bed-bound secondary to CVA, hemiplegia. Previously she had been getting up to an electric wheelchair over a year ago but has decided to remain bed-bound since. Jacqueline Shelton says require total ADL care for dressing, bathing, turning and positioning, toileting as she is incontinent. Jacqueline Shelton does feed herself an  appetite has been fair per staff. BMI 38.6.  Weight has been relatively stable. Jacqueline Shelton has been more isolated with covid pandemic. Jacqueline Shelton does talk on the phone to her family. Jacqueline Shelton has had no recent falls, wounds, hospitalizations. Jacqueline Shelton does remain a do not resuscitate. Medical goals consist of wishes for diagnostic testing, surgical intervention, lab testing, feeding tube, hospitalization, blood transfusion, antibiotics, IV fluids. Last primary provider note by [employer] nurse practitioner 2 / 11 / 2021 for hypothyroidism, doing "okay" no new changes to goals of care. At present Jacqueline Shelton was lying in bed. Jacqueline Shelton appears to be located but comfortable. No visitors present. I visited observe Jacqueline Shelton. We talked about purpose of palliative care visit. Jacqueline Shelton in agreement. We talked about how she is feeling today. Jacqueline Shelton endorses that it is a quiet day for her. She does not feel like doing much. Encourage Jacqueline Shelton to get out of bed but he declined. We talked about barriers to prevent pandemic, social isolation. Encourage Jacqueline Shelton to get out of bed, importance of mobility. We talked about chronic disease progression. We talked about symptoms of pain for which she does his permit all scheduled daily. Jacqueline. Shelton does get relief with her left arm pain. We talked about her appetite. Jacqueline. Shelton does continue to feed herself. Praised Jacqueline Shelton for feeding herself. Continue doing courage Jacqueline Shelton to be as independent as possible. We talked about quality of life. We talked about barriers involved in living at facility with debilitating chronic disease, relying on others for self care and lots of Independence. We talked about cooking strategies. We talked about quality of life. Therapeutic listening and emotional support provided. Most of a PC visit listening to Jacqueline Shelton, aphasia and it takes some time to understand her. Jacqueline Shelton was cooperative with assessment. Jacqueline. Shelton continues  to be stable. Discuss with Jacqueline Shelton will follow  with palliative care and roll of palliative care and plan of care. Will follow up in two months if needed or sooner should she declined. Jacqueline Shelton in agreement. I attempted to contact Jacqueline Shelton her daughter for update on palliative care visit, no answer. I have updated nursing staffing any changes to current goals or plan of care. Palliative Care was asked to help to continue to address goals of care.   CODE STATUS: DNR  PPS: 30% HOSPICE ELIGIBILITY/DIAGNOSIS: TBD  PAST MEDICAL HISTORY:  Past Medical History:  Diagnosis Date  . Anxiety   . Cataract   . CHF (congestive heart failure) ([place])   . CVA (cerebral vascular accident) ([place])   . Depression   . DM (diabetes mellitus) ([place])   . Dysphagia   . GERD (gastroesophageal reflux disease)   . Hemiplegia ([place])   . HLD (hyperlipidemia)   . HTN (hypertension)   . Hypothyroidism     SOCIAL HX:  Social History   Tobacco Use  . Smoking status: Not on file  Substance Use Topics  . Alcohol use: Not on file    ALLERGIES: No Known Allergies   PERTINENT MEDICATIONS:  Outpatient Encounter Medications as of 08/14/2019  Medication Sig  . acetaminophen (TYLENOL) 325 MG tablet Take 650 mg by mouth every 6 (six) hours as needed.  Marland Kitchen apixaban (ELIQUIS) 5 MG TABS tablet Take 5 mg by mouth 2 (two) times daily.  . furosemide (LASIX) 20 MG tablet Take 20 mg by mouth daily.  Marland Kitchen gabapentin (NEURONTIN) 300 MG capsule Take 300 mg by mouth 3 (three) times daily.  . insulin aspart protamine- aspart (NOVOLOG MIX 70/30) (70-30) 100 UNIT/ML injection Inject 30 Units into the skin 2 (two) times daily with a meal.  . levothyroxine (SYNTHROID) 50 MCG tablet Take 50 mcg by mouth daily before breakfast.  . lisinopril (ZESTRIL) 2.5 MG tablet Take 2.5 mg by mouth daily.  . magnesium oxide (MAG-OX) 400 MG tablet Take 400 mg by mouth 2 (two) times daily.  Marland Kitchen menthol-cetylpyridinium (CEPACOL) 3 MG lozenge Take 1  lozenge by mouth as needed for sore throat.  . metoprolol succinate (TOPROL-XL) 25 MG 24 hr tablet Take 25 mg by mouth daily.  Marland Kitchen omeprazole (PRILOSEC) 20 MG capsule Take 20 mg by mouth daily.  . polyethylene glycol (MIRALAX / GLYCOLAX) 17 g packet Take 17 g by mouth daily.  . pravastatin (PRAVACHOL) 20 MG tablet Take 20 mg by mouth at bedtime.  . senna (SENOKOT) 8.6 MG TABS tablet Take 2 tablets by mouth at bedtime.  . traMADol (ULTRAM) 50 MG tablet Take 100 mg by mouth 3 (three) times daily.  Marland Kitchen trolamine salicylate (ASPERCREME) 10 % cream Apply 1 application topically as needed for muscle pain.   No facility-administered encounter medications on file as of 08/14/2019.    PHYSICAL EXAM:   General: debilitated, pleasant female Cardiovascular: regular rate and rhythm Pulmonary: clear ant fields Abdomen: soft, nontender, + bowel sounds Extremities: +BLE edema, no joint deformities Neurological: hemiplegic;   Colten Desroches Z Andyn Sales, NP

## 2019-11-28 ENCOUNTER — Non-Acute Institutional Stay: Payer: Medicare Other | Admitting: Nurse Practitioner

## 2019-11-28 ENCOUNTER — Other Ambulatory Visit: Payer: Self-pay

## 2019-11-28 VITALS — BP 128/73 | HR 51 | Resp 18 | Wt 206.0 lb

## 2019-11-28 DIAGNOSIS — I639 Cerebral infarction, unspecified: Secondary | ICD-10-CM

## 2019-11-28 DIAGNOSIS — Z515 Encounter for palliative care: Secondary | ICD-10-CM

## 2019-11-28 NOTE — Progress Notes (Signed)
Therapist, nutritional Palliative Care Consult Note Telephone: [phone]  Fax: [phone]  PATIENT NAME: Jacqueline Shelton DOB: [birth year] MRN: [id number]  PRIMARY CARE PROVIDER:DrHodges REFERRING PROVIDER:DrHodges/[facility] RESPONSIBLE PARTY:Self Bernita Beckstrom daughter [phone] or [phone]  Recommendations: 1.ACP: DNR; Treat what is treatable;  2.Painsecondary to will continue to monitor on pain scale, monitor efficacy vs adverse side effects. Continue with current pain regimen withTramadol, gabapentin  3.Dysphagia ; secondary tolate onset CVA. Continue to monitor weights, appetite, aspiration precautions.  4.Palliative care encounter; Palliative medicine team will continue to support patient, patient's family, and medical team. Visit consisted of counseling and education dealing with the complex and emotionally intense issues of symptom management and palliative care in the setting of serious and potentially life-threatening illness  I spent 60 minutes providing this consultation,  Start at 11:00am. More than 50% of the time in this consultation was spent coordinating communication.   HISTORY OF PRESENT ILLNESS:  Jacqueline Shelton is a 59 y.o. year old female with multiple medical problems including Late onset CVA, hemiplegia, hemiparesis, dysphasia, aphasia, congestive heart failure, diabetes, hypertension, hyperlipidemia, hypothyroidism, GERD, bilateral cataracts, depression, anxiety.  Jacqueline Shelton continues to resign and [facility]. Jacqueline Shelton does require assistance for positioning, turning, mobility. Jacqueline Shelton does get her to the wheelchair where she is able to sit and has been outside visiting with family. Jacqueline Shelton does require total ADL dependents with incontinence. Jacqueline Shelton does feed herself an appetite has been good for staff. Current weight is 206  lb. Last primary provider visit with [facility] nurse practitioner 5 / 20 / 2021 for diabetes with hemoglobin A1c at go less than 9%. Blood sugar's range from 81 to 285. Medical goals are DNR, wishes are for antibiotics, blood transfusion, diagnostic testing, feeding tube, hospitalization, IV hydration, lab testing, surgical intervention. No recent falls, wounds, infections, hospitalizations. Jacqueline Shelton is able to advocate for her name though at times difficult to understand with aphasia. At present Jacqueline Shelton is lying in bed asleep. Jacqueline Shelton says awake to verbal cues. Jacqueline Shelton I talked about symptoms of pain and shortness of breath what she denies. Jacqueline Shelton and I talked about challenges with mobility. Jacqueline Shelton was agreeable to getting out of bed would have to use the hoyer lift. Jacqueline Shelton endorses today she just wants to sleep. Medical goals reviewed. Jacqueline Shelton was cooperative with assessment. Emotional support provided. I have attempted to contact Jacqueline Shelton daughter for update on palliative care visit the Jacqueline Shelton is her own responsible party. Updated nursing staff in the new changes to current goals or plan of care. Will follow up in two months if needed or sooner should she declined.  Palliative Care was asked to help address goals of care.   CODE STATUS: DNR  PPS: 30% HOSPICE ELIGIBILITY/DIAGNOSIS: TBD  PAST MEDICAL HISTORY:  Past Medical History:  Diagnosis Date  . Anxiety   . Cataract   . CHF (congestive heart failure) (HCC)   . CVA (cerebral vascular accident) (HCC)   . Depression   . DM (diabetes mellitus) (HCC)   . Dysphagia   . GERD (gastroesophageal reflux disease)   . Hemiplegia (HCC)   . HLD (hyperlipidemia)   . HTN (hypertension)   . Hypothyroidism     SOCIAL HX:  Social History   Tobacco Use  . Smoking status: Not on file  Substance Use Topics  . Alcohol use: Not  on file    ALLERGIES: No Known Allergies   PERTINENT MEDICATIONS:  Outpatient Encounter  Medications as of 11/28/2019  Medication Sig  . acetaminophen (TYLENOL) 325 MG tablet Take 650 mg by mouth every 6 (six) hours as needed.  Marland Kitchen apixaban (ELIQUIS) 5 MG TABS tablet Take 5 mg by mouth 2 (two) times daily.  . furosemide (LASIX) 20 MG tablet Take 20 mg by mouth daily.  Marland Kitchen gabapentin (NEURONTIN) 300 MG capsule Take 300 mg by mouth 3 (three) times daily.  . insulin aspart protamine- aspart (NOVOLOG MIX 70/30) (70-30) 100 UNIT/ML injection Inject 30 Units into the skin 2 (two) times daily with a meal.  . levothyroxine (SYNTHROID) 50 MCG tablet Take 50 mcg by mouth daily before breakfast.  . lisinopril (ZESTRIL) 2.5 MG tablet Take 2.5 mg by mouth daily.  . magnesium oxide (MAG-OX) 400 MG tablet Take 400 mg by mouth 2 (two) times daily.  Marland Kitchen menthol-cetylpyridinium (CEPACOL) 3 MG lozenge Take 1 lozenge by mouth as needed for sore throat.  . metoprolol succinate (TOPROL-XL) 25 MG 24 hr tablet Take 25 mg by mouth daily.  Marland Kitchen omeprazole (PRILOSEC) 20 MG capsule Take 20 mg by mouth daily.  . polyethylene glycol (MIRALAX / GLYCOLAX) 17 g packet Take 17 g by mouth daily.  . pravastatin (PRAVACHOL) 20 MG tablet Take 20 mg by mouth at bedtime.  . senna (SENOKOT) 8.6 MG TABS tablet Take 2 tablets by mouth at bedtime.  . traMADol (ULTRAM) 50 MG tablet Take 100 mg by mouth 3 (three) times daily.  Marland Kitchen trolamine salicylate (ASPERCREME) 10 % cream Apply 1 application topically as needed for muscle pain.   No facility-administered encounter medications on file as of 11/28/2019.    PHYSICAL EXAM:   General: NAD, debilitated, chronically ill, pleasant female; aphasic Cardiovascular: regular rate and rhythm Pulmonary: clear ant fields Neurological: functional quadriplegic; hemiplegia  Marland Reine Ihor Gully, NP

## 2019-12-17 NOTE — Progress Notes (Signed)
This encounter was created in error - please disregard.

## 2020-02-21 ENCOUNTER — Non-Acute Institutional Stay: Payer: Medicare Other | Admitting: Nurse Practitioner

## 2020-02-21 ENCOUNTER — Encounter: Payer: Self-pay | Admitting: Nurse Practitioner

## 2020-02-21 ENCOUNTER — Other Ambulatory Visit: Payer: Self-pay

## 2020-02-21 VITALS — BP 113/61 | Wt 207.0 lb

## 2020-02-21 DIAGNOSIS — I639 Cerebral infarction, unspecified: Secondary | ICD-10-CM

## 2020-02-21 DIAGNOSIS — Z515 Encounter for palliative care: Secondary | ICD-10-CM

## 2020-02-21 NOTE — Progress Notes (Signed)
Therapist, nutritional Palliative Care Consult Note Telephone: [phone]  Fax: [phone]  PATIENT NAME: Jacqueline Shelton DOB: Oct 06, 1960 MRN: [id number]  PRIMARY CARE PROVIDER:DrHodges REFERRING PROVIDER:DrHodges/[facility] RESPONSIBLE PARTY:Self Jacqueline Shelton daughter [phone] or [phone]  Recommendations: 1.ACP: DNR; Treat what is treatable;  2.Painsecondary to will continue to monitor on pain scale, monitor efficacy vs adverse side effects. Continue with current pain regimen withTramadol, gabapentin  3.Palliative care encounter; Palliative medicine team will continue to support patient, patient's family, and medical team. Visit consisted of counseling and education dealing with the complex and emotionally intense issues of symptom management and palliative care in the setting of serious and potentially life-threatening illness  4. F/u visit 2 months for ongoing monitoring mobility, appetite, weight, pain, disease progression  I spent 60 minutes providing this consultation, start at 10:30am. More than 50% of the time in this consultation was spent coordinating communication.   HISTORY OF PRESENT ILLNESS:  Jacqueline Shelton is a 59 y.o. year old female with multiple medical problems includingLate onset CVA, hemiplegia, hemiparesis, dysphasia, aphasia, congestive heart failure, diabetes, hypertension, hyperlipidemia, hypothyroidism, GERD, bilateral cataracts, depression, anxiety. Jacqueline Shelton continues to reside at [facility] care center. Jacqueline Shelton does remain bed-bound, functionally quadriplegic secondary to [employer], he mostly job. Jacqueline Shelton does require stop to turn in positioner. Jacqueline Shelton is hoyered to a wheelchair when she is agreeable to get up. Jacqueline Shelton does require total ADL assistance with bathing, dressing, toileting as she is incontinent. Jacqueline Shelton does require  assistance with feeding, tray setup, appetite fair. No recent falls, wounds, hospitalizations, infections. Medical goals but gets on DNR with wishes for surgical intervention, lab testing, dehydration, hospitalization, intubation, feeding tube, blood transfusion, antibiotic, diagnostic testing. Jacqueline Shelton has been refusing her medications including insulin intermittently. Last Primary Provider [facility] NP visit 01/17/2020 for diabetes with chronic kidney disease and A1C at call with ongoing improve gfr. Jacqueline Shelton is followed by Podiatry and dental at the facility. Staff endorses no new changes are concerns. At present Jacqueline Shelton is lying in bed, appears debilitated, comfortable. No visitors present. I visited and observed Jacqueline Shelton. We talked about purpose of Palliative Care visit and Jacqueline Shelton in agreement. We talked about how she is feeling today. Jacqueline Shelton not her head yes and said "okay". We talked about symptoms of pain and shortness of breath but she is not experiencing. Jacqueline. Shelton does continue to get tramadol for her left chronic arm pain which does provider relief. We talked about her appetite. We talked about mobility and getting out of bed which she nodded her head no. We talked about the importance of socialization and mobility. Medical goals reviewed. We talked about her family and visiting. We talked about challenges when relying on staff for basic needs. Jacqueline Shelton endorses that she does get a little frustrated when they don't come right away. We talked about coping strategies. We talked about role of palliative care and plan of care. We talked about follow up palliative care visit in three months if needed or soon or should she declined. At present time Jacqueline. Shelton does appear to be stable. I updated nursing staff many changes to current goals or plan of care. I have attempted to contact her daughter unable to leave a message for update on palliative care visit  Palliative Care was asked to  help to continue to address goals of care.   CODE STATUS: DNR  PPS: 30% HOSPICE ELIGIBILITY/DIAGNOSIS: TBD  PAST MEDICAL HISTORY:  Past Medical History:  Diagnosis Date  . Anxiety   . Cataract   . CHF (congestive heart failure) ([place])   . CVA (cerebral vascular accident) ([place])   . Depression   . DM (diabetes mellitus) ([place])   . Dysphagia   . GERD (gastroesophageal reflux disease)   . Hemiplegia ([place])   . HLD (hyperlipidemia)   . HTN (hypertension)   . Hypothyroidism     SOCIAL HX:  Social History   Tobacco Use  . Smoking status: Not on file  Substance Use Topics  . Alcohol use: Not on file    ALLERGIES: No Known Allergies   PERTINENT MEDICATIONS:  Outpatient Encounter Medications as of 02/21/2020  Medication Sig  . acetaminophen (TYLENOL) 325 MG tablet Take 650 mg by mouth every 6 (six) hours as needed.  Marland Kitchen apixaban (ELIQUIS) 5 MG TABS tablet Take 5 mg by mouth 2 (two) times daily.  . furosemide (LASIX) 20 MG tablet Take 20 mg by mouth daily.  Marland Kitchen gabapentin (NEURONTIN) 300 MG capsule Take 300 mg by mouth 3 (three) times daily.  . insulin aspart protamine- aspart (NOVOLOG MIX 70/30) (70-30) 100 UNIT/ML injection Inject 30 Units into the skin 2 (two) times daily with a meal.  . levothyroxine (SYNTHROID) 50 MCG tablet Take 50 mcg by mouth daily before breakfast.  . lisinopril (ZESTRIL) 2.5 MG tablet Take 2.5 mg by mouth daily.  . magnesium oxide (MAG-OX) 400 MG tablet Take 400 mg by mouth 2 (two) times daily.  Marland Kitchen menthol-cetylpyridinium (CEPACOL) 3 MG lozenge Take 1 lozenge by mouth as needed for sore throat.  . metoprolol succinate (TOPROL-XL) 25 MG 24 hr tablet Take 25 mg by mouth daily.  Marland Kitchen omeprazole (PRILOSEC) 20 MG capsule Take 20 mg by mouth daily.  . polyethylene glycol (MIRALAX / GLYCOLAX) 17 g packet Take 17 g by mouth daily.  . pravastatin (PRAVACHOL) 20 MG tablet Take 20 mg by mouth at bedtime.  . senna (SENOKOT) 8.6 MG TABS tablet Take 2 tablets by mouth at  bedtime.  . traMADol (ULTRAM) 50 MG tablet Take 100 mg by mouth 3 (three) times daily.  Marland Kitchen trolamine salicylate (ASPERCREME) 10 % cream Apply 1 application topically as needed for muscle pain.   No facility-administered encounter medications on file as of 02/21/2020.    PHYSICAL EXAM:   General: debilitated, chronically ill, pleasant aphasic female Cardiovascular: regular rate and rhythm Pulmonary: clear ant fields Neurological: functional quadriplegic  Chantalle Defilippo Ihor Gully, NP

## 2020-05-15 ENCOUNTER — Other Ambulatory Visit: Payer: Self-pay

## 2020-05-15 ENCOUNTER — Encounter: Payer: Self-pay | Admitting: Nurse Practitioner

## 2020-05-15 ENCOUNTER — Non-Acute Institutional Stay: Payer: Medicare Other | Admitting: Nurse Practitioner

## 2020-05-15 DIAGNOSIS — Z515 Encounter for palliative care: Secondary | ICD-10-CM

## 2020-05-15 DIAGNOSIS — I639 Cerebral infarction, unspecified: Secondary | ICD-10-CM

## 2020-05-15 NOTE — Progress Notes (Signed)
Therapist, nutritional Palliative Care Consult Note Telephone: [phone]  Fax: [phone]  PATIENT NAME: Jacqueline Shelton DOB: Nov 07, 1960 MRN: [id number]  PRIMARY CARE PROVIDER:[facility] RESPONSIBLE PARTY:Self Lawson Isabell daughter [phone] or [phone]  Recommendations: 1.ACP: DNR; Treat what is treatable;  2.Painsecondary to will continue to monitor on pain scale, monitor efficacy vs adverse side effects. Continue with current pain regimen withTramadol, gabapentin  3.Palliative care encounter; Palliative medicine team will continue to support patient, patient's family, and medical team. Visit consisted of counseling and education dealing with the complex and emotionally intense issues of symptom management and palliative care in the setting of serious and potentially life-threatening illness  4. F/u visit 2 months for ongoing monitoring mobility, appetite, weight, pain, disease progression  I spent 40 minutes providing this consultation,  Starting at 12:20pm. More than 50% of the time in this consultation was spent coordinating communication.   HISTORY OF PRESENT ILLNESS:  ISLAND DOHMEN is a 59 y.o. year old female with multiple medical problems including CVA, hemiplegia, hemiparesis, dysphasia, aphasia, congestive heart failure, diabetes, hypertension, hyperlipidemia, hypothyroidism, GERD, bilateral cataracts, depression, anxiety.Ms Leisinger continues to reside in [facility]. Ms Batch is bed-bound, total ADL dependence including bathing, dressing, toileting as she does remaining continent. Ms Mello does feed herself with her left hand. Current weight is 197.0 with BMI 36. No recent falls, wounds, infections, hospitalizations. Staff endorses no new changes or concerns. Ms Stivers has not been getting up in her wheelchair. At present Ms Boeder is lying in bed  appears debilitated, comfortable with her daughter Arley Phenix at bedside. We talked about purpose of palliative care visit. Ms Moralez and Arley Phenix both in agreement. We talked about how much difference has been feeling. Ms Brubacher endorses that she is doing fair. We talked about what that means to her. We talked about getting out of bed. We talked about symptoms of pain and shortness of breath which currently she is comfortable. She continues to get tramadol scheduled for pain which seems to be effective relief. We talked about her appetite. Ms. Havel endorses it depends on the food. Attempted to talk about mobility and the importance of getting out of bed. Ms. Scharnhorst was not really interested in speaking about that. We talked about purpose of Palliative care visit, medical goals reviewed. No new changes recommended at present time will continue to follow monitor with next visit in 2 months if needed or sooner should she declined. Ms France in agreement. Therapeutic listening, emotional support provided. Questions answered the satisfaction. I updated nursing staff  Palliative Care was asked to help to continue to address goals of care.   CODE STATUS: DNR  PPS: 30% HOSPICE ELIGIBILITY/DIAGNOSIS: TBD  PAST MEDICAL HISTORY:  Past Medical History:  Diagnosis Date  . Anxiety   . Cataract   . CHF (congestive heart failure) (HCC)   . CVA (cerebral vascular accident) (HCC)   . Depression   . DM (diabetes mellitus) (HCC)   . Dysphagia   . GERD (gastroesophageal reflux disease)   . Hemiplegia (HCC)   . HLD (hyperlipidemia)   . HTN (hypertension)   . Hypothyroidism     SOCIAL HX:  Social History   Tobacco Use  . Smoking status: Not on file  . Smokeless tobacco: Not on file  Substance Use Topics  . Alcohol use: Not on file    ALLERGIES: No Known Allergies    PHYSICAL EXAM:  General: NAD, debilitated, chronically ill, female Cardiovascular: regular rate and rhythm Pulmonary: clear ant  fields Neurological: functional quadriplegic  Junella Domke Ihor Gully, NP

## 2020-06-25 ENCOUNTER — Encounter: Payer: Self-pay | Admitting: Radiology

## 2020-06-25 ENCOUNTER — Emergency Department: Payer: Medicare Other

## 2020-06-25 ENCOUNTER — Inpatient Hospital Stay
Admission: EM | Admit: 2020-06-25 | Discharge: 2020-06-29 | DRG: 640 | Disposition: A | Discharge status: Skilled Nursing Facility | Payer: Medicare Other | Attending: Internal Medicine | Admitting: Internal Medicine

## 2020-06-25 DIAGNOSIS — I509 Heart failure, unspecified: Secondary | ICD-10-CM | POA: Diagnosis present

## 2020-06-25 DIAGNOSIS — E860 Dehydration: Principal | ICD-10-CM | POA: Diagnosis present

## 2020-06-25 DIAGNOSIS — I130 Hypertensive heart and chronic kidney disease with heart failure and stage 1 through stage 4 chronic kidney disease, or unspecified chronic kidney disease: Secondary | ICD-10-CM | POA: Diagnosis present

## 2020-06-25 DIAGNOSIS — E039 Hypothyroidism, unspecified: Secondary | ICD-10-CM | POA: Diagnosis present

## 2020-06-25 DIAGNOSIS — E785 Hyperlipidemia, unspecified: Secondary | ICD-10-CM | POA: Diagnosis present

## 2020-06-25 DIAGNOSIS — R0902 Hypoxemia: Secondary | ICD-10-CM | POA: Diagnosis present

## 2020-06-25 DIAGNOSIS — K219 Gastro-esophageal reflux disease without esophagitis: Secondary | ICD-10-CM | POA: Diagnosis present

## 2020-06-25 DIAGNOSIS — Z79890 Hormone replacement therapy (postmenopausal): Secondary | ICD-10-CM

## 2020-06-25 DIAGNOSIS — A4150 Gram-negative sepsis, unspecified: Secondary | ICD-10-CM | POA: Diagnosis not present

## 2020-06-25 DIAGNOSIS — E1165 Type 2 diabetes mellitus with hyperglycemia: Secondary | ICD-10-CM | POA: Diagnosis not present

## 2020-06-25 DIAGNOSIS — I69391 Dysphagia following cerebral infarction: Secondary | ICD-10-CM

## 2020-06-25 DIAGNOSIS — Z79899 Other long term (current) drug therapy: Secondary | ICD-10-CM

## 2020-06-25 DIAGNOSIS — E875 Hyperkalemia: Secondary | ICD-10-CM | POA: Diagnosis present

## 2020-06-25 DIAGNOSIS — I69351 Hemiplegia and hemiparesis following cerebral infarction affecting right dominant side: Secondary | ICD-10-CM

## 2020-06-25 DIAGNOSIS — R1310 Dysphagia, unspecified: Secondary | ICD-10-CM | POA: Diagnosis present

## 2020-06-25 DIAGNOSIS — E1122 Type 2 diabetes mellitus with diabetic chronic kidney disease: Secondary | ICD-10-CM | POA: Diagnosis present

## 2020-06-25 DIAGNOSIS — N390 Urinary tract infection, site not specified: Secondary | ICD-10-CM | POA: Diagnosis present

## 2020-06-25 DIAGNOSIS — G9341 Metabolic encephalopathy: Secondary | ICD-10-CM | POA: Diagnosis present

## 2020-06-25 DIAGNOSIS — R400 Somnolence: Secondary | ICD-10-CM

## 2020-06-25 DIAGNOSIS — A419 Sepsis, unspecified organism: Secondary | ICD-10-CM | POA: Diagnosis not present

## 2020-06-25 DIAGNOSIS — Z66 Do not resuscitate: Secondary | ICD-10-CM | POA: Diagnosis present

## 2020-06-25 DIAGNOSIS — N179 Acute kidney failure, unspecified: Secondary | ICD-10-CM | POA: Diagnosis present

## 2020-06-25 DIAGNOSIS — E870 Hyperosmolality and hypernatremia: Secondary | ICD-10-CM | POA: Diagnosis present

## 2020-06-25 DIAGNOSIS — E1142 Type 2 diabetes mellitus with diabetic polyneuropathy: Secondary | ICD-10-CM

## 2020-06-25 DIAGNOSIS — Z6834 Body mass index (BMI) 34.0-34.9, adult: Secondary | ICD-10-CM

## 2020-06-25 DIAGNOSIS — R6520 Severe sepsis without septic shock: Secondary | ICD-10-CM | POA: Diagnosis not present

## 2020-06-25 DIAGNOSIS — Z20822 Contact with and (suspected) exposure to covid-19: Secondary | ICD-10-CM | POA: Diagnosis present

## 2020-06-25 DIAGNOSIS — R319 Hematuria, unspecified: Secondary | ICD-10-CM | POA: Diagnosis present

## 2020-06-25 DIAGNOSIS — Z7901 Long term (current) use of anticoagulants: Secondary | ICD-10-CM | POA: Diagnosis not present

## 2020-06-25 DIAGNOSIS — Z794 Long term (current) use of insulin: Secondary | ICD-10-CM | POA: Diagnosis not present

## 2020-06-25 DIAGNOSIS — N184 Chronic kidney disease, stage 4 (severe): Secondary | ICD-10-CM | POA: Diagnosis present

## 2020-06-25 DIAGNOSIS — E669 Obesity, unspecified: Secondary | ICD-10-CM | POA: Diagnosis present

## 2020-06-25 LAB — CBC WITH DIFFERENTIAL/PLATELET
Abs Immature Granulocytes: 0 10*3/uL (ref 0.00–0.07)
Basophils Absolute: 0 10*3/uL (ref 0.0–0.1)
Basophils Relative: 0 %
Eosinophils Absolute: 0 10*3/uL (ref 0.0–0.5)
Eosinophils Relative: 0 %
HCT: 44 % (ref 36.0–46.0)
Hemoglobin: 12.9 g/dL (ref 12.0–15.0)
Lymphocytes Relative: 12 %
Lymphs Abs: 2.5 10*3/uL (ref 0.7–4.0)
MCH: 26.4 pg (ref 26.0–34.0)
MCHC: 29.3 g/dL — ABNORMAL LOW (ref 30.0–36.0)
MCV: 90 fL (ref 80.0–100.0)
Monocytes Absolute: 1.6 10*3/uL — ABNORMAL HIGH (ref 0.1–1.0)
Monocytes Relative: 8 %
Neutro Abs: 16.5 10*3/uL — ABNORMAL HIGH (ref 1.7–7.7)
Neutrophils Relative %: 80 %
Platelets: 350 10*3/uL (ref 150–400)
RBC: 4.89 MIL/uL (ref 3.87–5.11)
RDW: 16.7 % — ABNORMAL HIGH (ref 11.5–15.5)
WBC: 20.6 10*3/uL — ABNORMAL HIGH (ref 4.0–10.5)
nRBC: 0 % (ref 0.0–0.2)

## 2020-06-25 LAB — URINALYSIS, COMPLETE (UACMP) WITH MICROSCOPIC
Bilirubin Urine: NEGATIVE
Glucose, UA: NEGATIVE mg/dL
Ketones, ur: 5 mg/dL — AB
Nitrite: NEGATIVE
Protein, ur: 30 mg/dL — AB
Specific Gravity, Urine: 1.014 (ref 1.005–1.030)
pH: 6 (ref 5.0–8.0)

## 2020-06-25 LAB — TROPONIN I (HIGH SENSITIVITY)
Troponin I (High Sensitivity): 39 ng/L — ABNORMAL HIGH (ref <18)
Troponin I (High Sensitivity): 42 ng/L — ABNORMAL HIGH (ref <18)

## 2020-06-25 LAB — PROTIME-INR
INR: 2 — ABNORMAL HIGH (ref 0.8–1.2)
Prothrombin Time: 22 seconds — ABNORMAL HIGH (ref 11.4–15.2)

## 2020-06-25 LAB — COMPREHENSIVE METABOLIC PANEL
ALT: 8 U/L (ref 0–44)
AST: 19 U/L (ref 15–41)
Albumin: 3.6 g/dL (ref 3.5–5.0)
Alkaline Phosphatase: 78 U/L (ref 38–126)
Anion gap: 21 — ABNORMAL HIGH (ref 5–15)
BUN: 111 mg/dL — ABNORMAL HIGH (ref 6–20)
CO2: 26 mmol/L (ref 22–32)
Calcium: 8.4 mg/dL — ABNORMAL LOW (ref 8.9–10.3)
Chloride: 112 mmol/L — ABNORMAL HIGH (ref 98–111)
Creatinine, Ser: 3.55 mg/dL — ABNORMAL HIGH (ref 0.44–1.00)
GFR, Estimated: 14 mL/min — ABNORMAL LOW (ref >60)
Glucose, Bld: 180 mg/dL — ABNORMAL HIGH (ref 70–99)
Potassium: 6.6 mmol/L (ref 3.5–5.1)
Sodium: 159 mmol/L — ABNORMAL HIGH (ref 135–145)
Total Bilirubin: 1.8 mg/dL — ABNORMAL HIGH (ref 0.3–1.2)
Total Protein: 7.7 g/dL (ref 6.5–8.1)

## 2020-06-25 LAB — LACTIC ACID, PLASMA
Lactic Acid, Venous: 2.3 mmol/L (ref 0.5–1.9)
Lactic Acid, Venous: 2.1 mmol/L (ref 0.5–1.9)

## 2020-06-25 LAB — BLOOD GAS, ARTERIAL
Acid-Base Excess: 2.4 mmol/L — ABNORMAL HIGH (ref 0.0–2.0)
Bicarbonate: 27.2 mmol/L (ref 20.0–28.0)
FIO2: 0.28
O2 Saturation: 98 %
Patient temperature: 37
pCO2 arterial: 42 mmHg (ref 32.0–48.0)
pH, Arterial: 7.42 (ref 7.350–7.450)
pO2, Arterial: 102 mmHg (ref 83.0–108.0)

## 2020-06-25 LAB — CBG MONITORING, ED: Glucose-Capillary: 219 mg/dL — ABNORMAL HIGH (ref 70–99)

## 2020-06-25 LAB — SARS CORONAVIRUS 2 BY RT PCR (HOSPITAL ORDER, PERFORMED IN ~~LOC~~ HOSPITAL LAB): SARS Coronavirus 2: NEGATIVE

## 2020-06-25 LAB — APTT: aPTT: 37 seconds — ABNORMAL HIGH (ref 24–36)

## 2020-06-25 LAB — BRAIN NATRIURETIC PEPTIDE: B Natriuretic Peptide: 28.6 pg/mL (ref 0.0–100.0)

## 2020-06-25 MED ORDER — SODIUM CHLORIDE 0.9 % IV SOLN: INTRAVENOUS | Status: DC

## 2020-06-25 MED ORDER — ACETAMINOPHEN 650 MG RE SUPP: 650.0000 mg | Freq: Four times a day (QID) | RECTAL | Status: DC | PRN

## 2020-06-25 MED ORDER — LACTATED RINGERS IV BOLUS (SEPSIS): 1000.0000 mL | Freq: Once | INTRAVENOUS | Status: DC

## 2020-06-25 MED ORDER — PANTOPRAZOLE SODIUM 40 MG PO TBEC
40.0000 mg | DELAYED_RELEASE_TABLET | Freq: Every day | ORAL | Status: DC
  Administered 2020-06-28 – 2020-06-29 (×2): 40 mg via ORAL
  Filled 2020-06-25 (×3): qty 1

## 2020-06-25 MED ORDER — VANCOMYCIN HCL IN DEXTROSE 1-5 GM/200ML-% IV SOLN
1000.0000 mg | Freq: Once | INTRAVENOUS | Status: AC
  Administered 2020-06-25: 1000 mg via INTRAVENOUS
  Filled 2020-06-25: qty 200

## 2020-06-25 MED ORDER — MENTHOL 3 MG MT LOZG
1.0000 | LOZENGE | OROMUCOSAL | Status: DC | PRN
  Filled 2020-06-25: qty 9

## 2020-06-25 MED ORDER — SODIUM CHLORIDE 0.9 % IV SOLN
2.0000 g | INTRAVENOUS | Status: DC
  Administered 2020-06-26 – 2020-06-29 (×4): 2 g via INTRAVENOUS
  Filled 2020-06-25 (×4): qty 20
  Filled 2020-06-25: qty 2

## 2020-06-25 MED ORDER — APIXABAN 5 MG PO TABS
5.0000 mg | ORAL_TABLET | Freq: Two times a day (BID) | ORAL | Status: DC
  Administered 2020-06-26 – 2020-06-29 (×5): 5 mg via ORAL
  Filled 2020-06-25 (×6): qty 1

## 2020-06-25 MED ORDER — MAGNESIUM OXIDE 400 (241.3 MG) MG PO TABS
400.0000 mg | ORAL_TABLET | Freq: Two times a day (BID) | ORAL | Status: DC
  Administered 2020-06-26 – 2020-06-29 (×5): 400 mg via ORAL
  Filled 2020-06-25 (×6): qty 1

## 2020-06-25 MED ORDER — ASPIRIN EC 81 MG PO TBEC
81.0000 mg | DELAYED_RELEASE_TABLET | Freq: Every day | ORAL | Status: DC
  Administered 2020-06-28 – 2020-06-29 (×2): 81 mg via ORAL
  Filled 2020-06-25 (×3): qty 1

## 2020-06-25 MED ORDER — METOPROLOL SUCCINATE ER 25 MG PO TB24
25.0000 mg | ORAL_TABLET | Freq: Every day | ORAL | Status: DC
Start: 2020-06-26 — End: 2020-06-29
  Administered 2020-06-28 – 2020-06-29 (×2): 25 mg via ORAL
  Filled 2020-06-25 (×3): qty 1

## 2020-06-25 MED ORDER — SODIUM CHLORIDE 0.9 % IV BOLUS
2000.0000 mL | Freq: Once | INTRAVENOUS | Status: AC
  Administered 2020-06-25: 2000 mL via INTRAVENOUS

## 2020-06-25 MED ORDER — METRONIDAZOLE IN NACL 5-0.79 MG/ML-% IV SOLN
500.0000 mg | Freq: Once | INTRAVENOUS | Status: AC
  Administered 2020-06-25: 500 mg via INTRAVENOUS
  Filled 2020-06-25: qty 100

## 2020-06-25 MED ORDER — TROLAMINE SALICYLATE 10 % EX CREA
1.0000 "application " | TOPICAL_CREAM | CUTANEOUS | Status: DC | PRN
  Filled 2020-06-25: qty 85

## 2020-06-25 MED ORDER — ONDANSETRON HCL 4 MG/2ML IJ SOLN: 4.0000 mg | Freq: Four times a day (QID) | INTRAMUSCULAR | Status: DC | PRN

## 2020-06-25 MED ORDER — LACTATED RINGERS IV SOLN: INTRAVENOUS | Status: DC

## 2020-06-25 MED ORDER — LACTATED RINGERS IV BOLUS (SEPSIS)
1000.0000 mL | Freq: Once | INTRAVENOUS | Status: AC
  Administered 2020-06-25: 1000 mL via INTRAVENOUS

## 2020-06-25 MED ORDER — SENNA 8.6 MG PO TABS
2.0000 | ORAL_TABLET | Freq: Every day | ORAL | Status: DC
  Administered 2020-06-26 – 2020-06-28 (×3): 17.2 mg via ORAL
  Filled 2020-06-25 (×3): qty 2

## 2020-06-25 MED ORDER — GABAPENTIN 300 MG PO CAPS
300.0000 mg | ORAL_CAPSULE | Freq: Three times a day (TID) | ORAL | Status: DC
  Filled 2020-06-25: qty 1

## 2020-06-25 MED ORDER — INSULIN ASPART 100 UNIT/ML ~~LOC~~ SOLN
10.0000 [IU] | Freq: Once | SUBCUTANEOUS | Status: AC
  Administered 2020-06-25: 10 [IU] via INTRAVENOUS
  Filled 2020-06-25: qty 1

## 2020-06-25 MED ORDER — TRAZODONE HCL 50 MG PO TABS: 25.0000 mg | ORAL_TABLET | Freq: Every evening | ORAL | Status: DC | PRN

## 2020-06-25 MED ORDER — FUROSEMIDE 20 MG PO TABS
20.0000 mg | ORAL_TABLET | Freq: Every day | ORAL | Status: DC
  Filled 2020-06-25: qty 1

## 2020-06-25 MED ORDER — SODIUM ZIRCONIUM CYCLOSILICATE 10 G PO PACK
10.0000 g | PACK | Freq: Once | ORAL | Status: DC
  Filled 2020-06-25: qty 1

## 2020-06-25 MED ORDER — POLYETHYLENE GLYCOL 3350 17 G PO PACK
17.0000 g | PACK | Freq: Every day | ORAL | Status: DC
  Administered 2020-06-28 – 2020-06-29 (×2): 17 g via ORAL
  Filled 2020-06-25 (×3): qty 1

## 2020-06-25 MED ORDER — TRAMADOL HCL 50 MG PO TABS
100.0000 mg | ORAL_TABLET | Freq: Three times a day (TID) | ORAL | Status: DC
  Administered 2020-06-27 – 2020-06-29 (×5): 100 mg via ORAL
  Filled 2020-06-25 (×6): qty 2

## 2020-06-25 MED ORDER — MAGNESIUM HYDROXIDE 400 MG/5ML PO SUSP: 30.0000 mL | Freq: Every day | ORAL | Status: DC | PRN

## 2020-06-25 MED ORDER — LEVOTHYROXINE SODIUM 50 MCG PO TABS
50.0000 ug | ORAL_TABLET | Freq: Every day | ORAL | Status: DC
  Administered 2020-06-26 – 2020-06-29 (×4): 50 ug via ORAL
  Filled 2020-06-25 (×4): qty 1

## 2020-06-25 MED ORDER — ONDANSETRON HCL 4 MG PO TABS: 4.0000 mg | ORAL_TABLET | Freq: Four times a day (QID) | ORAL | Status: DC | PRN

## 2020-06-25 MED ORDER — PRAVASTATIN SODIUM 20 MG PO TABS
20.0000 mg | ORAL_TABLET | Freq: Every day | ORAL | Status: DC
  Administered 2020-06-26 – 2020-06-28 (×3): 20 mg via ORAL
  Filled 2020-06-25 (×4): qty 1

## 2020-06-25 MED ORDER — DEXTROSE 50 % IV SOLN
1.0000 | Freq: Once | INTRAVENOUS | Status: AC
  Administered 2020-06-25: 50 mL via INTRAVENOUS

## 2020-06-25 MED ORDER — LISINOPRIL 5 MG PO TABS
2.5000 mg | ORAL_TABLET | Freq: Every day | ORAL | Status: DC
  Filled 2020-06-25: qty 1

## 2020-06-25 MED ORDER — CALCIUM GLUCONATE-NACL 1-0.675 GM/50ML-% IV SOLN
1.0000 g | Freq: Once | INTRAVENOUS | Status: AC
  Administered 2020-06-25: 1000 mg via INTRAVENOUS
  Filled 2020-06-25: qty 50

## 2020-06-25 MED ORDER — SODIUM CHLORIDE 0.9 % IV SOLN
2.0000 g | Freq: Once | INTRAVENOUS | Status: AC
  Administered 2020-06-25: 2 g via INTRAVENOUS
  Filled 2020-06-25: qty 2

## 2020-06-25 MED ORDER — ACETAMINOPHEN 325 MG PO TABS: 650.0000 mg | ORAL_TABLET | Freq: Four times a day (QID) | ORAL | Status: DC | PRN

## 2020-06-25 NOTE — Sepsis Progress Note (Signed)
eLink monitoring this Code Sepsis.

## 2020-06-25 NOTE — ED Provider Notes (Signed)
[facility] Emergency Department Provider Note   ____________________________________________   Event Date/Time   First MD Initiated Contact with Patient 06/25/20 1459     (approximate)  I have reviewed the triage vital signs and the nursing notes.   HISTORY  Chief Complaint Altered Mental Status    HPI Jacqueline Shelton is a 60 y.o. female with a past medical history of CVA with residual hemiplegia, CHF, hypertension, hypothyroidism, type 2 diabetes who presents from her long-term care facility via EMS after being unresponsive for the last 3 days.  Family states that last known normal was approximately 1 week ago.  Per EMS, nursing home staff did not want to send patient until family had arrived given that she is DNR and with other advanced directives.  Patient arrives with GCS of 8 (E2 V2 M4).  Further history and review of systems unable to assess given mental status.         Past Medical History:  Diagnosis Date  . Anxiety   . Cataract   . CHF (congestive heart failure) (HCC)   . CVA (cerebral vascular accident) (HCC)   . Depression   . DM (diabetes mellitus) (HCC)   . Dysphagia   . GERD (gastroesophageal reflux disease)   . Hemiplegia (HCC)   . HLD (hyperlipidemia)   . HTN (hypertension)   . Hypothyroidism     Patient Active Problem List   Diagnosis Date Noted  . Dysphagia 07/16/2018  . Chronic pain syndrome 07/16/2018  . Palliative care encounter 07/16/2018    No past surgical history on file.  Prior to Admission medications   Medication Sig Start Date End Date Taking? Authorizing Provider  acetaminophen (TYLENOL) 325 MG tablet Take 650 mg by mouth every 6 (six) hours as needed.    [provider]  apixaban (ELIQUIS) 5 MG TABS tablet Take 5 mg by mouth 2 (two) times daily.    [provider]  furosemide (LASIX) 20 MG tablet Take 20 mg by mouth daily.    [provider]  gabapentin (NEURONTIN) 300 MG  capsule Take 300 mg by mouth 3 (three) times daily.    [provider]  insulin aspart protamine- aspart (NOVOLOG MIX 70/30) (70-30) 100 UNIT/ML injection Inject 30 Units into the skin 2 (two) times daily with a meal.    [provider]  levothyroxine (SYNTHROID) 50 MCG tablet Take 50 mcg by mouth daily before breakfast.    [provider]  lisinopril (ZESTRIL) 2.5 MG tablet Take 2.5 mg by mouth daily.    [provider]  magnesium oxide (MAG-OX) 400 MG tablet Take 400 mg by mouth 2 (two) times daily.    [provider]  menthol-cetylpyridinium (CEPACOL) 3 MG lozenge Take 1 lozenge by mouth as needed for sore throat.    [provider]  metoprolol succinate (TOPROL-XL) 25 MG 24 hr tablet Take 25 mg by mouth daily.    [provider]  omeprazole (PRILOSEC) 20 MG capsule Take 20 mg by mouth daily.    [provider]  polyethylene glycol (MIRALAX / GLYCOLAX) 17 g packet Take 17 g by mouth daily.    [provider]  pravastatin (PRAVACHOL) 20 MG tablet Take 20 mg by mouth at bedtime.    [provider]  senna (SENOKOT) 8.6 MG TABS tablet Take 2 tablets by mouth at bedtime.    [provider]  traMADol (ULTRAM) 50 MG tablet Take 100 mg by mouth 3 (three)  times daily.    [provider]  trolamine salicylate (ASPERCREME) 10 % cream Apply 1 application topically as needed for muscle pain.    [provider]    Allergies Patient has no known allergies.  No family history on file.  Social History    Review of Systems Unable to assess ____________________________________________   PHYSICAL EXAM:  VITAL SIGNS: ED Triage Vitals [06/25/20 1502]  Enc Vitals Group     BP 124/72     Pulse Rate (!) 117     Resp (!) 22     Temp 100.2 F (37.9 C)     Temp Source Oral     SpO2 95 %     Weight      Height      Head Circumference      Peak Flow      Pain Score      Pain Loc       Pain Edu?      Excl. in GC?    Constitutional: In stretcher with eyes closed.  Morbidly obese elderly female in no acute distress Eyes: Conjunctivae are normal.  Pupils 5 mm and reactive Head: Atraumatic. Nose: No congestion/rhinnorhea. Mouth/Throat: Mucous membranes are dry Neck: No stridor Cardiovascular: Grossly normal heart sounds.  Good peripheral circulation. Respiratory: Normal respiratory effort.  No retractions. Gastrointestinal: Soft and nontender. No distention. Genitourinary: Normal external female genitalia with mild erythema to the vulva and perineal region Musculoskeletal: No obvious deformities Neurologic: GCS 8 (E2 V2 M4) Skin:  Skin is warm and dry. No rash noted. ____________________________________________   LABS (all labs ordered are listed, but only abnormal results are displayed)  Labs Reviewed  LACTIC ACID, PLASMA - Abnormal; Notable for the following components:      Result Value   Lactic Acid, Venous 2.3 (*)    All other components within normal limits  LACTIC ACID, PLASMA - Abnormal; Notable for the following components:   Lactic Acid, Venous 2.1 (*)    All other components within normal limits  COMPREHENSIVE METABOLIC PANEL - Abnormal; Notable for the following components:   Sodium 159 (*)    Potassium 6.6 (*)    Chloride 112 (*)    Glucose, Bld 180 (*)    BUN 111 (*)    Creatinine, Ser 3.55 (*)    Calcium 8.4 (*)    Total Bilirubin 1.8 (*)    GFR, Estimated 14 (*)    Anion gap 21 (*)    All other components within normal limits  CBC WITH DIFFERENTIAL/PLATELET - Abnormal; Notable for the following components:   WBC 20.6 (*)    MCHC 29.3 (*)    RDW 16.7 (*)    Neutro Abs 16.5 (*)    Monocytes Absolute 1.6 (*)    All other components within normal limits  PROTIME-INR - Abnormal; Notable for the following components:   Prothrombin Time 22.0 (*)    INR 2.0 (*)    All other components within normal limits  APTT - Abnormal; Notable for the  following components:   aPTT 37 (*)    All other components within normal limits  URINALYSIS, COMPLETE (UACMP) WITH MICROSCOPIC - Abnormal; Notable for the following components:   Color, Urine YELLOW (*)    APPearance HAZY (*)    Hgb urine dipstick MODERATE (*)    Ketones, ur 5 (*)    Protein, ur 30 (*)    Leukocytes,Ua MODERATE (*)    Bacteria, UA RARE (*)  All other components within normal limits  BLOOD GAS, ARTERIAL - Abnormal; Notable for the following components:   Acid-Base Excess 2.4 (*)    All other components within normal limits  CBG MONITORING, ED - Abnormal; Notable for the following components:   Glucose-Capillary 219 (*)    All other components within normal limits  TROPONIN I (HIGH SENSITIVITY) - Abnormal; Notable for the following components:   Troponin I (High Sensitivity) 39 (*)    All other components within normal limits  SARS CORONAVIRUS 2 BY RT PCR (HOSPITAL ORDER, [facility])  CULTURE, BLOOD (ROUTINE X 2)  CULTURE, BLOOD (ROUTINE X 2)  URINE CULTURE  BRAIN NATRIURETIC PEPTIDE  TROPONIN I (HIGH SENSITIVITY)   ____________________________________________  EKG  ED ECG REPORT I, Naaman Plummer, the attending physician, personally viewed and interpreted this ECG.  Date: 06/25/2020 EKG Time: 1452 Rate: 116 Rhythm: Tachycardic sinus rhythm QRS Axis: normal Intervals: normal ST/T Wave abnormalities: normal Narrative Interpretation: no evidence of acute ischemia  ____________________________________________  RADIOLOGY  ED MD interpretation: CT of the head without contrast shows no evidence of acute abnormalities including no intracranial hemorrhage, edema, or obvious masses  One-view portable x-ray of the chest shows no evidence of acute abnormalities including no pneumonia, pneumothorax, or widened mediastinum  Official radiology report(s): CT Head Wo Contrast  Result Date: 06/25/2020 CLINICAL DATA:  Decreased level  of consciousness for 3 days EXAM: CT HEAD WITHOUT CONTRAST TECHNIQUE: Contiguous axial images were obtained from the base of the skull through the vertex without intravenous contrast. COMPARISON:  01/09/2013 FINDINGS: Brain: No acute infarct or hemorrhage. Lateral ventricles and midline structures are stable. Chronic lacunar infarct left basal ganglia. No acute extra-axial fluid collections. No mass effect. Vascular: No hyperdense vessel or unexpected calcification. Skull: Normal. Negative for fracture or focal lesion. Sinuses/Orbits: No acute finding. Other: None. IMPRESSION: 1. No acute intracranial process. Electronically Signed   By: Randa Ngo M.D.   On: 06/25/2020 15:57   DG Chest Port 1 View  Result Date: 06/25/2020 CLINICAL DATA:  Sepsis EXAM: PORTABLE CHEST 1 VIEW COMPARISON:  01/13/2013 FINDINGS: Cardiac shadow is within normal limits. The lungs are clear bilaterally. No focal infiltrate is seen. Degenerative changes of the thoracic spine are noted. IMPRESSION: No active disease. Electronically Signed   By: Inez Catalina M.D.   On: 06/25/2020 16:11    ____________________________________________   PROCEDURES  Procedure(s) performed (including Critical Care):  .1-3 Lead EKG Interpretation Performed by: Naaman Plummer, MD Authorized by: Naaman Plummer, MD     Interpretation: normal     ECG rate:  97   ECG rate assessment: normal     Rhythm: sinus rhythm     Ectopy: none     Conduction: normal   .Critical Care Performed by: Naaman Plummer, MD Authorized by: Naaman Plummer, MD   Critical care provider statement:    Critical care time (minutes):  47   Critical care time was exclusive of:  Separately billable procedures and treating other patients   Critical care was necessary to treat or prevent imminent or life-threatening deterioration of the following conditions:  Sepsis   Critical care was time spent personally by me on the following activities:  Discussions with  consultants, evaluation of patient's response to treatment, examination of patient, ordering and performing treatments and interventions, ordering and review of laboratory studies, ordering and review of radiographic studies, pulse oximetry, re-evaluation of patient's condition, obtaining history from patient or  surrogate and review of old charts   I assumed direction of critical care for this patient from another provider in my specialty: no     Care discussed with: admitting provider       ____________________________________________   INITIAL IMPRESSION / ASSESSMENT AND PLAN / ED COURSE  As part of my medical decision making, I reviewed the following data within the [place] notes reviewed and incorporated, Labs reviewed, EKG interpreted, Old chart reviewed, Radiograph reviewed and Notes from prior ED visits reviewed and incorporated        Patient is a 60 year old female who presents from her long-term care facility for decreased level of consciousness.  Differential for diagnosis for this patient includes but is not limited to: Intracranial hemorrhage, sepsis, ACS, medication side effect/overdose, meningitis/encephalitis  Patient shows evidence of urosepsis with acute on chronic renal failure requiring admission to the internal medicine service.  Patient treated empirically with 30 cc/kg bolus of crystalloid as well as empiric antibiotics  Given patient's DNR/DNI, patient's GCS of 8 will forego intubation but will be be monitored closely.  She is protecting her airway at this point and oxygenating well.     ____________________________________________   FINAL CLINICAL IMPRESSION(S) / ED DIAGNOSES  Final diagnoses:  Somnolence  Urinary tract infection with hematuria, site unspecified  Sepsis with acute renal failure without septic shock, due to unspecified organism, unspecified acute renal failure type [facility])     ED Discharge Orders    None        Note:  This document was prepared using Dragon voice recognition software and may include unintentional dictation errors.   Naaman Plummer, MD 06/25/20 [phone]

## 2020-06-25 NOTE — Consult Note (Addendum)
PHARMACY -  BRIEF ANTIBIOTIC NOTE   Pharmacy has received consult(s) for Cefepime/Vancomycin from an ED provider.  The patient's profile has been reviewed for ht/wt/allergies/indication/available labs.    One time order(s) placed for Cefepime 2g IV x 1 and Vancomycin $RemoveBefore'1000mg'RwxbywIgVrodn$  IV x 1  Will complete vancomycin loading dose as appropriate when weight updated (update - will give and additional $RemoveBefore'1000mg'BmdfeCcupAcDy$  for a total loading dose of $Remov'2000mg'ZAshfc$ )  Further antibiotics/pharmacy consults should be ordered by admitting physician if indicated.                       Thank you,  Lu Duffel, PharmD, BCPS Clinical Pharmacist 06/25/2020 3:21 PM

## 2020-06-25 NOTE — ED Notes (Signed)
Lab notified to collect repeat troponin

## 2020-06-25 NOTE — Consult Note (Signed)
CODE SEPSIS - PHARMACY COMMUNICATION  **Broad Spectrum Antibiotics should be administered within 1 hour of Sepsis diagnosis**  Time Code Sepsis Called/Page Received: 1513  Antibiotics Ordered: Cefepime/Vancomcyin/Metronidazole  Time of 1st antibiotic administration: 1535  Additional action taken by pharmacy: none  If necessary, Name of Provider/Nurse Contacted: [place] ,PharmD Clinical Pharmacist  06/25/2020  3:17 PM

## 2020-06-25 NOTE — H&P (Addendum)
[place]   PATIENT NAME: Jacqueline Shelton    MR#:  [id number]  DATE OF BIRTH:  August 17, 1960  DATE OF ADMISSION:  06/25/2020  PRIMARY CARE PHYSICIAN: Maryella Shivers, MD   REQUESTING/REFERRING PHYSICIAN: Naaman Plummer, MD CHIEF COMPLAINT:   Chief Complaint  Patient presents with  . Altered Mental Status    HISTORY OF PRESENT ILLNESS:  Jacqueline Shelton  is a 60 y.o. female with a known history of CHF, depression, type diabetes mellitus, hypertension, dyslipidemia, hypothyroidism and GERD, who presented to the emergency room with acute onset of altered mental status with decreased responsiveness over the last 3 days at [facility].  She was having a GCS of 8.  The patient was very drowsy during my interview but arousable.  She could barely answer any question.  Upon presentation to the ER, heart rate was 117, respiratory it was 22 then 28 and pulse ox was 95% on room air and later 90 was 97% on 2 L of O2 by nasal cannula with a temperature of 100.2.  Labs revealed UA that was positive for UTI and leukocytosis of 20.6 with neutrophilia, lactic acid of 2.3 and later 2.1 and CMP remarkable for hyperkalemia of 6.6 and sodium of 159 hyperchloremia 112.  Blood glucose was 180 and BUN was 111 with a creatinine of 3.55 above previous levels.  Total bili was 1.8 and otherwise LFTs were unremarkable.  The patient was given 1 ampoule IV calcium gluconate, D50/insulin, IV cefepime, vancomycin and Flagyl, 2 L bolus of IV normal saline 1 L bolus of IV lactated Ringer.  She will be admitted to a medical monitored bed for further evaluation and management. PAST MEDICAL HISTORY:   Past Medical History:  Diagnosis Date  . Anxiety   . Cataract   . CHF (congestive heart failure) ([place])   . CVA (cerebral vascular accident) ([place])   . Depression   . DM (diabetes mellitus) ([place])   . Dysphagia   . GERD (gastroesophageal reflux disease)   . Hemiplegia ([place])   . HLD (hyperlipidemia)    . HTN (hypertension)   . Hypothyroidism     PAST SURGICAL HISTORY:  No past surgical history on file.  Unobtainable due to the patient's altered mental status.  SOCIAL HISTORY:   Social History   Tobacco Use  . Smoking status: Not on file  . Smokeless tobacco: Not on file  Substance Use Topics  . Alcohol use: Not on file  She has no reported history of tobacco EtOH abuse or illicit drug use.  FAMILY HISTORY:  No family history on file.  Unobtainable due to the patient's altered mental status.  DRUG ALLERGIES:  No Known Allergies  REVIEW OF SYSTEMS:   ROS As per history of present illness. All pertinent systems were reviewed above. Constitutional, HEENT, cardiovascular, respiratory, GI, GU, musculoskeletal, neuro, psychiatric, endocrine, integumentary and hematologic systems were reviewed and are otherwise negative/unremarkable except for positive findings mentioned above in the HPI.   MEDICATIONS AT HOME:   Prior to Admission medications   Medication Sig Start Date End Date Taking? Authorizing Provider  acetaminophen (TYLENOL) 325 MG tablet Take 650 mg by mouth every 6 (six) hours as needed.    [provider]  apixaban (ELIQUIS) 5 MG TABS tablet Take 5 mg by mouth 2 (two) times daily.    [provider]  furosemide (LASIX) 20 MG tablet Take 20 mg by mouth daily.    [provider]  gabapentin (NEURONTIN) 300 MG capsule Take 300 mg by mouth 3 (three) times daily.    [provider]  insulin aspart protamine- aspart (NOVOLOG MIX 70/30) (70-30) 100 UNIT/ML injection Inject 30 Units into the skin 2 (two) times daily with a meal.    [provider]  levothyroxine (SYNTHROID) 50 MCG tablet Take 50 mcg by mouth daily before breakfast.    [provider]  lisinopril (ZESTRIL) 2.5 MG tablet Take 2.5 mg by mouth daily.    [provider]  magnesium oxide (MAG-OX) 400 MG tablet Take 400 mg by mouth 2 (two) times daily.     [provider]  menthol-cetylpyridinium (CEPACOL) 3 MG lozenge Take 1 lozenge by mouth as needed for sore throat.    [provider]  metoprolol succinate (TOPROL-XL) 25 MG 24 hr tablet Take 25 mg by mouth daily.    [provider]  omeprazole (PRILOSEC) 20 MG capsule Take 20 mg by mouth daily.    [provider]  polyethylene glycol (MIRALAX / GLYCOLAX) 17 g packet Take 17 g by mouth daily.    [provider]  pravastatin (PRAVACHOL) 20 MG tablet Take 20 mg by mouth at bedtime.    [provider]  senna (SENOKOT) 8.6 MG TABS tablet Take 2 tablets by mouth at bedtime.    [provider]  traMADol (ULTRAM) 50 MG tablet Take 100 mg by mouth 3 (three) times daily.    [provider]  trolamine salicylate (ASPERCREME) 10 % cream Apply 1 application topically as needed for muscle pain.    [provider]      VITAL SIGNS:  Blood pressure 130/76, pulse 96, temperature 100.2 F (37.9 C), temperature source Oral, resp. rate (!) 25, height $RemoveBe'5\' 2"'XhtEKpyHQ$  (1.575 m), weight 95 kg, SpO2 97 %.  PHYSICAL EXAMINATION:  Physical Exam  GENERAL:  60 y.o.-year-old female patient lying in the bed with no acute distress.  She is very somnolent but arousable. EYES: Pupils equal, round, reactive to light and accommodation. No scleral icterus. Extraocular muscles intact.  HEENT: Head atraumatic, normocephalic. Oropharynx and nasopharynx clear.  NECK:  Supple, no jugular venous distention. No thyroid enlargement, no tenderness.  LUNGS: Normal breath sounds bilaterally, no wheezing, rales,rhonchi or crepitation. No use of accessory muscles of respiration.  CARDIOVASCULAR: Regular rate and rhythm, S1, S2 normal. No murmurs, rubs, or gallops.  ABDOMEN: Soft, nondistended, nontender. Bowel sounds present. No organomegaly or mass.  EXTREMITIES: No pedal edema, cyanosis, or clubbing.  NEUROLOGIC: Cranial nerves II through XII are intact. Muscle  strength 5/5 in all extremities. Sensation intact. Gait not checked.  PSYCHIATRIC: The patient is somnolent but arousable.  No good eye contact. SKIN: No obvious rash, lesion, or ulcer.   LABORATORY PANEL:   CBC Recent Labs  Lab 06/25/20 1507  WBC 20.6*  HGB 12.9  HCT 44.0  PLT 350   ------------------------------------------------------------------------------------------------------------------  Chemistries  Recent Labs  Lab 06/25/20 1507  NA 159*  K 6.6*  CL 112*  CO2 26  GLUCOSE 180*  BUN 111*  CREATININE 3.55*  CALCIUM 8.4*  AST 19  ALT 8  ALKPHOS 78  BILITOT 1.8*   ------------------------------------------------------------------------------------------------------------------  Cardiac Enzymes No results for input(s): TROPONINI in the last 168 hours. ------------------------------------------------------------------------------------------------------------------  RADIOLOGY:  CT Head Wo Contrast  Result Date: 06/25/2020 CLINICAL DATA:  Decreased level of consciousness for 3 days EXAM: CT HEAD WITHOUT CONTRAST TECHNIQUE: Contiguous axial images were obtained from the base of the skull through the  vertex without intravenous contrast. COMPARISON:  01/09/2013 FINDINGS: Brain: No acute infarct or hemorrhage. Lateral ventricles and midline structures are stable. Chronic lacunar infarct left basal ganglia. No acute extra-axial fluid collections. No mass effect. Vascular: No hyperdense vessel or unexpected calcification. Skull: Normal. Negative for fracture or focal lesion. Sinuses/Orbits: No acute finding. Other: None. IMPRESSION: 1. No acute intracranial process. Electronically Signed   By: Randa Ngo M.D.   On: 06/25/2020 15:57   DG Chest Port 1 View  Result Date: 06/25/2020 CLINICAL DATA:  Sepsis EXAM: PORTABLE CHEST 1 VIEW COMPARISON:  01/13/2013 FINDINGS: Cardiac shadow is within normal limits. The lungs are clear bilaterally. No focal infiltrate is seen.  Degenerative changes of the thoracic spine are noted. IMPRESSION: No active disease. Electronically Signed   By: Inez Catalina M.D.   On: 06/25/2020 16:11      IMPRESSION AND PLAN:   1.  Sepsis due to UTI.  She has leukocytosis as well as tachypnea and tachycardia.  She meets severe sepsis criteria due to acute kidney injury on top of her stage IIIb chronic kidney disease and altered mental status.  She also has associated mild hypoxemia. -The patient will be admitted to a medical monitored bed. -She received boluses of IV normal saline and lactated Ringer and will be continued on infusion with normal saline. -We will continue antibiotic therapy with IV Rocephin from tomorrow morning. -We will follow urine and blood cultures. -We will follow her lactic acid level. -We will hold off her nephrotoxins and follow BMP with hydration. -Neurochecks will be followed every 4 hours for 24 hours. -O2 protocol will be followed.  2.  Hyperkalemia. -This was managed in the ER and I added leukoma. -We will follow potassium levels.  3.  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 -The patient will be placed on supplement coverage with NovoLog and will continue basal coverage. -We will hold off her Metformin especially given acute kidney injury. -We will continue her Neurontin.  4.  Hypothyroidism. -We will continue Synthroid and check TSH.  5.  Essential hypertension. -We will hold off her Zestril given acute kidney injury and continue Toprol-XL.  6.  Dyslipidemia. -Statin therapy will be resumed.  8.  History of CVA. -We will continue her Eliquis.  9.  DVT prophylaxis. --Her Eliquis Will Be Continued.   All the records are reviewed and case discussed with ED provider. The plan of care was discussed in details with the patient (and family). I answered all questions. The patient agreed to proceed with the above mentioned plan. Further management will  depend upon hospital course.   CODE STATUS: Full code  Status is: Inpatient  Remains inpatient appropriate because:Altered mental status, Ongoing diagnostic testing needed not appropriate for outpatient work up, Unsafe d/c plan, IV treatments appropriate due to intensity of illness or inability to take PO and Inpatient level of care appropriate due to severity of illness   Dispo: The patient is from: SNF              Anticipated d/c is to: SNF              Anticipated d/c date is: 2 days              Patient currently is not medically stable to d/c.   Difficult to place patient No   TOTAL TIME TAKING CARE OF THIS PATIENT: 55 minutes.    Christel Mormon M.D on 06/25/2020 at 8:41  PM  [facility]   From 7 PM-7 AM, contact night-coverage [email or web address]  CC: Primary care physician; Maryella Shivers, MD

## 2020-06-25 NOTE — ED Triage Notes (Signed)
Pt presents to the [facility] via EMS from [facility] with c/o decreased level of consciousness. EMS states that pt has only been responsive to pain over the past 3 days with no oral intake since onset of symptoms.

## 2020-06-25 NOTE — ED Notes (Signed)
Pt's daughter updated on patient's condition and current plan of care.

## 2020-06-26 ENCOUNTER — Encounter: Payer: Self-pay | Admitting: Family Medicine

## 2020-06-26 ENCOUNTER — Other Ambulatory Visit: Payer: Self-pay

## 2020-06-26 DIAGNOSIS — E870 Hyperosmolality and hypernatremia: Secondary | ICD-10-CM | POA: Diagnosis present

## 2020-06-26 DIAGNOSIS — N179 Acute kidney failure, unspecified: Secondary | ICD-10-CM

## 2020-06-26 LAB — PROCALCITONIN: Procalcitonin: 0.84 ng/mL

## 2020-06-26 LAB — CBC
HCT: 39.9 % (ref 36.0–46.0)
Hemoglobin: 11.8 g/dL — ABNORMAL LOW (ref 12.0–15.0)
MCH: 26.3 pg (ref 26.0–34.0)
MCHC: 29.6 g/dL — ABNORMAL LOW (ref 30.0–36.0)
MCV: 89.1 fL (ref 80.0–100.0)
Platelets: 252 10*3/uL (ref 150–400)
RBC: 4.48 MIL/uL (ref 3.87–5.11)
RDW: 16.3 % — ABNORMAL HIGH (ref 11.5–15.5)
WBC: 17.7 10*3/uL — ABNORMAL HIGH (ref 4.0–10.5)
nRBC: 0 % (ref 0.0–0.2)

## 2020-06-26 LAB — BASIC METABOLIC PANEL
Anion gap: 17 — ABNORMAL HIGH (ref 5–15)
Anion gap: 12 (ref 5–15)
Anion gap: 13 (ref 5–15)
BUN: 94 mg/dL — ABNORMAL HIGH (ref 6–20)
BUN: 91 mg/dL — ABNORMAL HIGH (ref 6–20)
BUN: 78 mg/dL — ABNORMAL HIGH (ref 6–20)
CO2: 25 mmol/L (ref 22–32)
CO2: 28 mmol/L (ref 22–32)
CO2: 26 mmol/L (ref 22–32)
Calcium: 8.1 mg/dL — ABNORMAL LOW (ref 8.9–10.3)
Calcium: 8.4 mg/dL — ABNORMAL LOW (ref 8.9–10.3)
Calcium: 8.5 mg/dL — ABNORMAL LOW (ref 8.9–10.3)
Chloride: 116 mmol/L — ABNORMAL HIGH (ref 98–111)
Chloride: 122 mmol/L — ABNORMAL HIGH (ref 98–111)
Chloride: 116 mmol/L — ABNORMAL HIGH (ref 98–111)
Creatinine, Ser: 3.26 mg/dL — ABNORMAL HIGH (ref 0.44–1.00)
Creatinine, Ser: 2.91 mg/dL — ABNORMAL HIGH (ref 0.44–1.00)
Creatinine, Ser: 2.39 mg/dL — ABNORMAL HIGH (ref 0.44–1.00)
GFR, Estimated: 16 mL/min — ABNORMAL LOW (ref >60)
GFR, Estimated: 18 mL/min — ABNORMAL LOW (ref >60)
GFR, Estimated: 23 mL/min — ABNORMAL LOW (ref >60)
Glucose, Bld: 258 mg/dL — ABNORMAL HIGH (ref 70–99)
Glucose, Bld: 189 mg/dL — ABNORMAL HIGH (ref 70–99)
Glucose, Bld: 342 mg/dL — ABNORMAL HIGH (ref 70–99)
Potassium: 4.5 mmol/L (ref 3.5–5.1)
Potassium: 4.3 mmol/L (ref 3.5–5.1)
Potassium: 4.2 mmol/L (ref 3.5–5.1)
Sodium: 158 mmol/L — ABNORMAL HIGH (ref 135–145)
Sodium: 162 mmol/L (ref 135–145)
Sodium: 155 mmol/L — ABNORMAL HIGH (ref 135–145)

## 2020-06-26 LAB — HIV ANTIBODY (ROUTINE TESTING W REFLEX): HIV Screen 4th Generation wRfx: NONREACTIVE

## 2020-06-26 LAB — CORTISOL-AM, BLOOD: Cortisol - AM: 27.6 ug/dL — ABNORMAL HIGH (ref 6.7–22.6)

## 2020-06-26 LAB — PROTIME-INR
INR: 1.8 — ABNORMAL HIGH (ref 0.8–1.2)
Prothrombin Time: 20 seconds — ABNORMAL HIGH (ref 11.4–15.2)

## 2020-06-26 LAB — TSH: TSH: 0.337 u[IU]/mL — ABNORMAL LOW (ref 0.350–4.500)

## 2020-06-26 MED ORDER — GABAPENTIN 300 MG PO CAPS
300.0000 mg | ORAL_CAPSULE | Freq: Two times a day (BID) | ORAL | Status: DC
  Administered 2020-06-26 – 2020-06-29 (×5): 300 mg via ORAL
  Filled 2020-06-26 (×5): qty 1

## 2020-06-26 MED ORDER — DEXTROSE 5 % IV SOLN: INTRAVENOUS | Status: DC

## 2020-06-26 MED ORDER — DEXTROSE-NACL 5-0.45 % IV SOLN: INTRAVENOUS | Status: DC

## 2020-06-26 MED ORDER — CHLORHEXIDINE GLUCONATE CLOTH 2 % EX PADS
6.0000 | MEDICATED_PAD | Freq: Every day | CUTANEOUS | Status: DC
  Administered 2020-06-26 – 2020-06-29 (×4): 6 via TOPICAL

## 2020-06-26 NOTE — Progress Notes (Signed)
   06/26/20 1400  Clinical Encounter Type  Visited With Patient  Visit Type Initial;Spiritual support;Social support  Referral From Nurse  Consult/Referral To Chaplain  Spiritual Encounters  Spiritual Needs Prayer;Emotional  While rounding [place] visited Pt in room !Jacqueline Shelton. Pt. Is non-verbal due to a stroke but she yet tried to use her words with me. I offered prayer and she was able to say yes. Prayer was given and accepted.

## 2020-06-26 NOTE — Progress Notes (Signed)
[facility] Liaison note:  Patient is currently followed by [facility] program at [facility] TOC Meagan Hagwood made aware. Liaison to follow for discharge disposition. Thank you. Flo Shanks BSN, RN, [place] [employer] [phone]

## 2020-06-26 NOTE — Plan of Care (Signed)
New care plan initiated 

## 2020-06-26 NOTE — ED Notes (Signed)
Pt is responding now to verbal stimuli, once in room she will open eyes to sound of voice instead of requiring touch to respond.  VSS

## 2020-06-26 NOTE — Progress Notes (Addendum)
Progress Note    Jacqueline Shelton  LEX:[id number] DOB: 1960/11/07  DOA: 06/25/2020 PCP: Maryella Shivers, MD      Brief Narrative:    Medical records reviewed and are as summarized below:  Jacqueline Shelton is a 60 y.o. female with a known history of  stroke with right-sided weakness, CHF, depression, type diabetes mellitus, hypertension, dyslipidemia, hypothyroidism and GERD, who presented to the emergency room with progressively worsening altered mental status.      Assessment/Plan:   Principal Problem:   Sepsis due to gram-negative UTI (HCC) Active Problems:   Hypernatremia   AKI (acute kidney injury) ([place])   Body mass index is 34.02 kg/m.  (Obesity)     Sepsis secondary to UTI: Continue empiric IV antibiotics.  Follow-up urine and blood cultures.  Hypernatremia: Sodium level is worsening.  Change IV fluids from D5 half NS to 5% dextrose.  Monitor sodium level closely.  AKI on CKD stage IV, hyperkalemia: BUN and creatinine are slowly improving.  Potassium level has normalized. Hold lisinopril because of AKI  Acute metabolic encephalopathy: Continue supportive care  Chronic CHF: Specifics unknown.  Hold Lasix because of AKI and hypernatremia  History of stroke: Resume Eliquis when patient is able to swallow.   Diet Order            Diet heart healthy/carb modified Room service appropriate? Yes; Fluid consistency: Thin  Diet effective now                    Consultants:  None  Procedures:  None    Medications:   . apixaban  5 mg Oral BID  . aspirin EC  81 mg Oral Daily  . Chlorhexidine Gluconate Cloth  6 each Topical Daily  . gabapentin  300 mg Oral BID  . levothyroxine  50 mcg Oral QAC breakfast  . magnesium oxide  400 mg Oral BID  . metoprolol succinate  25 mg Oral Daily  . pantoprazole  40 mg Oral Daily  . polyethylene glycol  17 g Oral Daily  . pravastatin  20 mg Oral QHS  . senna  2 tablet Oral QHS  . sodium zirconium  cyclosilicate  10 g Oral Once  . traMADol  100 mg Oral TID   Continuous Infusions: . cefTRIAXone (ROCEPHIN)  IV 200 mL/hr at 06/26/20 0622  . dextrose 125 mL/hr at 06/26/20 0839     Anti-infectives (From admission, onward)   Start     Dose/Rate Route Frequency Ordered Stop   06/26/20 0700  cefTRIAXone (ROCEPHIN) 2 g in sodium chloride 0.9 % 100 mL IVPB        2 g 200 mL/hr over 30 Minutes Intravenous Every 24 hours 06/25/20 2040     06/25/20 1615  vancomycin (VANCOCIN) IVPB 1000 mg/200 mL premix        1,000 mg 200 mL/hr over 60 Minutes Intravenous  Once 06/25/20 1554 06/25/20 1857   06/25/20 1515  ceFEPIme (MAXIPIME) 2 g in sodium chloride 0.9 % 100 mL IVPB        2 g 200 mL/hr over 30 Minutes Intravenous  Once 06/25/20 1513 06/25/20 1630   06/25/20 1515  metroNIDAZOLE (FLAGYL) IVPB 500 mg        500 mg 100 mL/hr over 60 Minutes Intravenous  Once 06/25/20 1513 06/25/20 1731   06/25/20 1515  vancomycin (VANCOCIN) IVPB 1000 mg/200 mL premix        1,000 mg 200 mL/hr over 60 Minutes  Intravenous  Once 06/25/20 1513 06/25/20 1958             Family Communication/Anticipated D/C date and plan/Code Status   DVT prophylaxis:  apixaban (ELIQUIS) tablet 5 mg     Code Status: DNR  Family Communication: With her daughter, Jacqueline Shelton Disposition Plan:    Status is: Inpatient  Remains inpatient appropriate because:Altered mental status, IV treatments appropriate due to intensity of illness or inability to take PO and Inpatient level of care appropriate due to severity of illness   Dispo: The patient is from: SNF              Anticipated d/c is to: SNF              Anticipated d/c date is: > 3 days              Patient currently is not medically stable to d/c.   Difficult to place patient No           Subjective:   Interval events noted.  She is unresponsive and unable to provide any history.  Her daughter, Jacqueline Shelton, is at the bedside.  Objective:    Vitals:    06/26/20 0230 06/26/20 0316 06/26/20 0419 06/26/20 0739  BP: 134/73 116/70 115/61 132/63  Pulse: 92 90 89 81  Resp: $Remo'19 20 18 17  'JwJeV$ Temp:  98.2 F (36.8 C) 98.4 F (36.9 C) 99.2 F (37.3 C)  TempSrc:      SpO2: 93% 99% 99% 100%  Weight:   84.4 kg   Height:       No data found.   Intake/Output Summary (Last 24 hours) at 06/26/2020 1237 Last data filed at 06/26/2020 1026 Gross per 24 hour  Intake 917.98 ml  Output 600 ml  Net 317.98 ml   Filed Weights   06/25/20 1551 06/26/20 0419  Weight: 95 kg 84.4 kg    Exam:  GEN: NAD SKIN: Warm and dry EYES: No pallor or icterus ENT: MMM CV: RRR PULM: CTA B ABD: soft, obese, +BS CNS: Minimally responsive to painful stimuli.  Right facial droop.  She does not follow commands. EXT: No edema    Data Reviewed:   I have personally reviewed following labs and imaging studies:  Labs: Labs show the following:   Basic Metabolic Panel: Recent Labs  Lab 06/25/20 1507 06/25/20 1940 06/26/20 0328  NA 159* 158* 162*  K 6.6* 4.5 4.3  CL 112* 116* 122*  CO2 $Re'26 25 28  'tBV$ GLUCOSE 180* 258* 189*  BUN 111* 94* 91*  CREATININE 3.55* 3.26* 2.91*  CALCIUM 8.4* 8.1* 8.4*   GFR Estimated Creatinine Clearance: 20.7 mL/min (A) (by C-G formula based on SCr of 2.91 mg/dL (H)). Liver Function Tests: Recent Labs  Lab 06/25/20 1507  AST 19  ALT 8  ALKPHOS 78  BILITOT 1.8*  PROT 7.7  ALBUMIN 3.6   No results for input(s): LIPASE, AMYLASE in the last 168 hours. No results for input(s): AMMONIA in the last 168 hours. Coagulation profile Recent Labs  Lab 06/25/20 1507 06/26/20 0328  INR 2.0* 1.8*    CBC: Recent Labs  Lab 06/25/20 1507 06/26/20 0328  WBC 20.6* 17.7*  NEUTROABS 16.5*  --   HGB 12.9 11.8*  HCT 44.0 39.9  MCV 90.0 89.1  PLT 350 252   Cardiac Enzymes: No results for input(s): CKTOTAL, CKMB, CKMBINDEX, TROPONINI in the last 168 hours. BNP (last 3 results) No results for input(s): PROBNP in the last 8760  hours. CBG: Recent Labs  Lab 06/25/20 1806  GLUCAP 219*   D-Dimer: No results for input(s): DDIMER in the last 72 hours. Hgb A1c: No results for input(s): HGBA1C in the last 72 hours. Lipid Profile: No results for input(s): CHOL, HDL, LDLCALC, TRIG, CHOLHDL, LDLDIRECT in the last 72 hours. Thyroid function studies: Recent Labs    06/25/20 1940  TSH 0.337*   Anemia work up: No results for input(s): VITAMINB12, FOLATE, FERRITIN, TIBC, IRON, RETICCTPCT in the last 72 hours. Sepsis Labs: Recent Labs  Lab 06/25/20 1507 06/25/20 1530 06/26/20 0328  PROCALCITON  --   --  0.84  WBC 20.6*  --  17.7*  LATICACIDVEN 2.3* 2.1*  --     Microbiology Recent Results (from the past 240 hour(s))  SARS Coronavirus 2 by RT PCR (hospital order, performed in [facility] hospital lab) Nasopharyngeal Nasopharyngeal Swab     Status: None   Collection Time: 06/25/20  3:27 PM   Specimen: Nasopharyngeal Swab  Result Value Ref Range Status   SARS Coronavirus 2 NEGATIVE NEGATIVE Final    Comment: (NOTE) SARS-CoV-2 target nucleic acids are NOT DETECTED.  The SARS-CoV-2 RNA is generally detectable in upper and lower respiratory specimens during the acute phase of infection. The lowest concentration of SARS-CoV-2 viral copies this assay can detect is 250 copies / mL. A negative result does not preclude SARS-CoV-2 infection and should not be used as the sole basis for treatment or other patient management decisions.  A negative result may occur with improper specimen collection / handling, submission of specimen other than nasopharyngeal swab, presence of viral mutation(s) within the areas targeted by this assay, and inadequate number of viral copies (<250 copies / mL). A negative result must be combined with clinical observations, patient history, and epidemiological information.  Fact Sheet for Patients:   BoilerBrush.com.cy  Fact Sheet for Healthcare  Providers: https://pope.com/  This test is not yet approved or  cleared by the [place] FDA and has been authorized for detection and/or diagnosis of SARS-CoV-2 by FDA under an Emergency Use Authorization (EUA).  This EUA will remain in effect (meaning this test can be used) for the duration of the COVID-19 declaration under Section 564(b)(1) of the Act, 21 U.S.C. section 360bbb-3(b)(1), unless the authorization is terminated or revoked sooner.  Performed at [facility], [address] [place], [place] [address]   Blood Culture (routine x 2)     Status: None (Preliminary result)   Collection Time: 06/25/20  3:27 PM   Specimen: BLOOD  Result Value Ref Range Status   Specimen Description BLOOD BLOOD LEFT HAND  Final   Special Requests   Final    BOTTLES DRAWN AEROBIC AND ANAEROBIC Blood Culture results may not be optimal due to an inadequate volume of blood received in culture bottles   Culture   Final    NO GROWTH < 24 HOURS Performed at [facility], [address], [place], [place] [address]    Report Status PENDING  Incomplete  Blood Culture (routine x 2)     Status: None (Preliminary result)   Collection Time: 06/25/20  3:27 PM   Specimen: BLOOD  Result Value Ref Range Status   Specimen Description BLOOD BLOOD RIGHT HAND  Final   Special Requests   Final    BOTTLES DRAWN AEROBIC AND ANAEROBIC Blood Culture results may not be optimal due to an inadequate volume of blood received in culture bottles   Culture   Final  NO GROWTH < 24 HOURS Performed at [facility], [address] [place], [place] [address]    Report Status PENDING  Incomplete    Procedures and diagnostic studies:  CT Head Wo Contrast  Result Date: 06/25/2020 CLINICAL DATA:  Decreased level of consciousness for 3 days EXAM: CT HEAD WITHOUT CONTRAST TECHNIQUE: Contiguous axial images were obtained from the base of the skull through the vertex  without intravenous contrast. COMPARISON:  01/09/2013 FINDINGS: Brain: No acute infarct or hemorrhage. Lateral ventricles and midline structures are stable. Chronic lacunar infarct left basal ganglia. No acute extra-axial fluid collections. No mass effect. Vascular: No hyperdense vessel or unexpected calcification. Skull: Normal. Negative for fracture or focal lesion. Sinuses/Orbits: No acute finding. Other: None. IMPRESSION: 1. No acute intracranial process. Electronically Signed   By: Randa Ngo M.D.   On: 06/25/2020 15:57   DG Chest Port 1 View  Result Date: 06/25/2020 CLINICAL DATA:  Sepsis EXAM: PORTABLE CHEST 1 VIEW COMPARISON:  01/13/2013 FINDINGS: Cardiac shadow is within normal limits. The lungs are clear bilaterally. No focal infiltrate is seen. Degenerative changes of the thoracic spine are noted. IMPRESSION: No active disease. Electronically Signed   By: Inez Catalina M.D.   On: 06/25/2020 16:11               LOS: 1 day   Arnold Kester  [facility]   Pager on [email or web address]. If 7PM-7AM, please contact night-coverage at [email or web address]     06/26/2020, 12:37 PM

## 2020-06-26 NOTE — Progress Notes (Addendum)
Patient is a long term resident at [facility].   Jacqueline Shelton, [place]

## 2020-06-27 DIAGNOSIS — G9341 Metabolic encephalopathy: Secondary | ICD-10-CM

## 2020-06-27 LAB — URINE CULTURE

## 2020-06-27 LAB — BASIC METABOLIC PANEL
Anion gap: 11 (ref 5–15)
BUN: 61 mg/dL — ABNORMAL HIGH (ref 6–20)
CO2: 26 mmol/L (ref 22–32)
Calcium: 8.3 mg/dL — ABNORMAL LOW (ref 8.9–10.3)
Chloride: 111 mmol/L (ref 98–111)
Creatinine, Ser: 1.97 mg/dL — ABNORMAL HIGH (ref 0.44–1.00)
GFR, Estimated: 29 mL/min — ABNORMAL LOW (ref >60)
Glucose, Bld: 333 mg/dL — ABNORMAL HIGH (ref 70–99)
Potassium: 3.9 mmol/L (ref 3.5–5.1)
Sodium: 148 mmol/L — ABNORMAL HIGH (ref 135–145)

## 2020-06-27 LAB — CBC WITH DIFFERENTIAL/PLATELET
Abs Immature Granulocytes: 0.06 10*3/uL (ref 0.00–0.07)
Basophils Absolute: 0 10*3/uL (ref 0.0–0.1)
Basophils Relative: 0 %
Eosinophils Absolute: 0.2 10*3/uL (ref 0.0–0.5)
Eosinophils Relative: 2 %
HCT: 34.4 % — ABNORMAL LOW (ref 36.0–46.0)
Hemoglobin: 10.1 g/dL — ABNORMAL LOW (ref 12.0–15.0)
Immature Granulocytes: 1 %
Lymphocytes Relative: 21 %
Lymphs Abs: 2.1 10*3/uL (ref 0.7–4.0)
MCH: 26.2 pg (ref 26.0–34.0)
MCHC: 29.4 g/dL — ABNORMAL LOW (ref 30.0–36.0)
MCV: 89.1 fL (ref 80.0–100.0)
Monocytes Absolute: 0.5 10*3/uL (ref 0.1–1.0)
Monocytes Relative: 4 %
Neutro Abs: 7.3 10*3/uL (ref 1.7–7.7)
Neutrophils Relative %: 72 %
Platelets: 173 10*3/uL (ref 150–400)
RBC: 3.86 MIL/uL — ABNORMAL LOW (ref 3.87–5.11)
RDW: 15.9 % — ABNORMAL HIGH (ref 11.5–15.5)
WBC: 10.1 10*3/uL (ref 4.0–10.5)
nRBC: 0 % (ref 0.0–0.2)

## 2020-06-27 LAB — GLUCOSE, CAPILLARY
Glucose-Capillary: 247 mg/dL — ABNORMAL HIGH (ref 70–99)
Glucose-Capillary: 249 mg/dL — ABNORMAL HIGH (ref 70–99)
Glucose-Capillary: 215 mg/dL — ABNORMAL HIGH (ref 70–99)
Glucose-Capillary: 134 mg/dL — ABNORMAL HIGH (ref 70–99)

## 2020-06-27 MED ORDER — INSULIN ASPART 100 UNIT/ML ~~LOC~~ SOLN
0.0000 [IU] | Freq: Three times a day (TID) | SUBCUTANEOUS | Status: DC
  Administered 2020-06-27 (×2): 3 [IU] via SUBCUTANEOUS
  Administered 2020-06-28: 1 [IU] via SUBCUTANEOUS
  Administered 2020-06-28: 2 [IU] via SUBCUTANEOUS
  Administered 2020-06-28 – 2020-06-29 (×2): 1 [IU] via SUBCUTANEOUS
  Filled 2020-06-27 (×6): qty 1

## 2020-06-27 NOTE — Progress Notes (Addendum)
Progress Note    Jacqueline Shelton  XIH:[id number] DOB: Dec 02, 1960  DOA: 06/25/2020 PCP: Maryella Shivers, MD      Brief Narrative:    Medical records reviewed and are as summarized below:  Jacqueline Shelton is a 60 y.o. female with a known history of  stroke with right-sided weakness, CHF, depression, type diabetes mellitus, hypertension, dyslipidemia, hypothyroidism and GERD, who presented to the emergency room with progressively worsening altered mental status.  She was found to have sepsis secondary to UTI, AKI on CKD stage IV, acute hypernatremia and acute metabolic encephalopathy.  She was treated with empiric IV Rocephin and IV fluids.    Assessment/Plan:   Principal Problem:   Sepsis due to gram-negative UTI (HCC) Active Problems:   Hypernatremia   AKI (acute kidney injury) ([place])   Acute metabolic encephalopathy   Body mass index is 34.02 kg/m.  (Obesity)     Sepsis secondary to UTI: Continue IV Rocephin.  Urine culture showed multiple species.  No growth on blood culture thus far.   Hypernatremia: Sodium level is improving.  Decrease 5% dextrose infusion from 125 cc/h to 50 cc/h.  Monitor sodium level closely.    AKI on CKD stage IV, hyperkalemia: BUN and creatinine are slowly improving.  Potassium level has normalized. Hold lisinopril because of AKI.  Continue IV fluids.  Acute metabolic encephalopathy due to hypernatremia: Continue supportive care  Type II DM with hyperglycemia likely due to IV dextrose infusion.  Start NovoLog as needed for hyperglycemia.  Monitor glucose levels closely.  Check hemoglobin A1c.  Chronic CHF: Specifics unknown.  Hold Lasix because of AKI and hypernatremia  History of stroke: Resume Eliquis when patient is able to swallow.  Addendum: Plan of care was discussed with her daughter, Jacqueline Shelton, who said that patient was more awake later in the day and she was even awake enough to eat a little bit of food.   Diet Order             Diet heart healthy/carb modified Room service appropriate? Yes; Fluid consistency: Thin  Diet effective now                    Consultants:  None  Procedures:  None    Medications:   . apixaban  5 mg Oral BID  . aspirin EC  81 mg Oral Daily  . Chlorhexidine Gluconate Cloth  6 each Topical Daily  . gabapentin  300 mg Oral BID  . insulin aspart  0-9 Units Subcutaneous TID WC  . levothyroxine  50 mcg Oral QAC breakfast  . magnesium oxide  400 mg Oral BID  . metoprolol succinate  25 mg Oral Daily  . pantoprazole  40 mg Oral Daily  . polyethylene glycol  17 g Oral Daily  . pravastatin  20 mg Oral QHS  . senna  2 tablet Oral QHS  . sodium zirconium cyclosilicate  10 g Oral Once  . traMADol  100 mg Oral TID   Continuous Infusions: . cefTRIAXone (ROCEPHIN)  IV 2 g (06/27/20 0625)  . dextrose 50 mL/hr at 06/27/20 0816     Anti-infectives (From admission, onward)   Start     Dose/Rate Route Frequency Ordered Stop   06/26/20 0700  cefTRIAXone (ROCEPHIN) 2 g in sodium chloride 0.9 % 100 mL IVPB        2 g 200 mL/hr over 30 Minutes Intravenous Every 24 hours 06/25/20 2040     06/25/20 1615  vancomycin (VANCOCIN) IVPB 1000 mg/200 mL premix        1,000 mg 200 mL/hr over 60 Minutes Intravenous  Once 06/25/20 1554 06/25/20 1857   06/25/20 1515  ceFEPIme (MAXIPIME) 2 g in sodium chloride 0.9 % 100 mL IVPB        2 g 200 mL/hr over 30 Minutes Intravenous  Once 06/25/20 1513 06/25/20 1630   06/25/20 1515  metroNIDAZOLE (FLAGYL) IVPB 500 mg        500 mg 100 mL/hr over 60 Minutes Intravenous  Once 06/25/20 1513 06/25/20 1731   06/25/20 1515  vancomycin (VANCOCIN) IVPB 1000 mg/200 mL premix        1,000 mg 200 mL/hr over 60 Minutes Intravenous  Once 06/25/20 1513 06/25/20 1958             Family Communication/Anticipated D/C date and plan/Code Status   DVT prophylaxis:  apixaban (ELIQUIS) tablet 5 mg     Code Status: DNR  Family Communication: with her  daughter, Jacqueline Shelton Disposition Plan:    Status is: Inpatient  Remains inpatient appropriate because:Altered mental status, IV treatments appropriate due to intensity of illness or inability to take PO and Inpatient level of care appropriate due to severity of illness   Dispo: The patient is from: SNF              Anticipated d/c is to: SNF              Anticipated d/c date is: > 3 days              Patient currently is not medically stable to d/c.   Difficult to place patient No           Subjective:   Interval events noted.  She is lethargic and unable to provide any history.  Objective:    Vitals:   06/26/20 2325 06/27/20 0323 06/27/20 0750 06/27/20 0756  BP: (!) 120/56 117/60 (!) 88/47 (!) 110/48  Pulse: 94 93 83   Resp: $Remo'20 20 16   'lPzRA$ Temp: 98.6 F (37 C) 98.4 F (36.9 C) 99.1 F (37.3 C)   TempSrc: Oral Oral Oral   SpO2: 100% 100% 100%   Weight:      Height:       No data found.   Intake/Output Summary (Last 24 hours) at 06/27/2020 1052 Last data filed at 06/26/2020 1300 Gross per 24 hour  Intake 0 ml  Output --  Net 0 ml   Filed Weights   06/25/20 1551 06/26/20 0419  Weight: 95 kg 84.4 kg    Exam:  GEN: NAD SKIN: Warm and dry EYES: No pallor or icterus ENT: MMM CV: RRR PULM: CTA B ABD: soft, obese, NT, +BS CNS: Lethargic.  Right facial droop.  She does not follow commands. EXT: No edema or tenderness        Data Reviewed:   I have personally reviewed following labs and imaging studies:  Labs: Labs show the following:   Basic Metabolic Panel: Recent Labs  Lab 06/25/20 1507 06/25/20 1940 06/26/20 0328 06/26/20 1453 06/27/20 0549  NA 159* 158* 162* 155* 148*  K 6.6* 4.5 4.3 4.2 3.9  CL 112* 116* 122* 116* 111  CO2 $Re'26 25 28 26 26  'PSl$ GLUCOSE 180* 258* 189* 342* 333*  BUN 111* 94* 91* 78* 61*  CREATININE 3.55* 3.26* 2.91* 2.39* 1.97*  CALCIUM 8.4* 8.1* 8.4* 8.5* 8.3*   GFR Estimated Creatinine Clearance: 30.6 mL/min (A) (by  C-G formula  based on SCr of 1.97 mg/dL (H)). Liver Function Tests: Recent Labs  Lab 06/25/20 1507  AST 19  ALT 8  ALKPHOS 78  BILITOT 1.8*  PROT 7.7  ALBUMIN 3.6   No results for input(s): LIPASE, AMYLASE in the last 168 hours. No results for input(s): AMMONIA in the last 168 hours. Coagulation profile Recent Labs  Lab 06/25/20 1507 06/26/20 0328  INR 2.0* 1.8*    CBC: Recent Labs  Lab 06/25/20 1507 06/26/20 0328 06/27/20 0549  WBC 20.6* 17.7* 10.1  NEUTROABS 16.5*  --  7.3  HGB 12.9 11.8* 10.1*  HCT 44.0 39.9 34.4*  MCV 90.0 89.1 89.1  PLT 350 252 173   Cardiac Enzymes: No results for input(s): CKTOTAL, CKMB, CKMBINDEX, TROPONINI in the last 168 hours. BNP (last 3 results) No results for input(s): PROBNP in the last 8760 hours. CBG: Recent Labs  Lab 06/25/20 1806 06/27/20 0823  GLUCAP 219* 247*   D-Dimer: No results for input(s): DDIMER in the last 72 hours. Hgb A1c: No results for input(s): HGBA1C in the last 72 hours. Lipid Profile: No results for input(s): CHOL, HDL, LDLCALC, TRIG, CHOLHDL, LDLDIRECT in the last 72 hours. Thyroid function studies: Recent Labs    06/25/20 1940  TSH 0.337*   Anemia work up: No results for input(s): VITAMINB12, FOLATE, FERRITIN, TIBC, IRON, RETICCTPCT in the last 72 hours. Sepsis Labs: Recent Labs  Lab 06/25/20 1507 06/25/20 1530 06/26/20 0328 06/27/20 0549  PROCALCITON  --   --  0.84  --   WBC 20.6*  --  17.7* 10.1  LATICACIDVEN 2.3* 2.1*  --   --     Microbiology Recent Results (from the past 240 hour(s))  Urine culture     Status: Abnormal   Collection Time: 06/25/20  3:07 PM   Specimen: Urine, Random  Result Value Ref Range Status   Specimen Description   Final    URINE, RANDOM Performed at [facility], [address] [place], [place] [address]    Special Requests   Final    NONE Performed at [facility], [address] [place], [place] [address]    Culture MULTIPLE  SPECIES PRESENT, SUGGEST RECOLLECTION (A)  Final   Report Status 06/27/2020 FINAL  Final  SARS Coronavirus 2 by RT PCR (hospital order, performed in [place] hospital lab) Nasopharyngeal Nasopharyngeal Swab     Status: None   Collection Time: 06/25/20  3:27 PM   Specimen: Nasopharyngeal Swab  Result Value Ref Range Status   SARS Coronavirus 2 NEGATIVE NEGATIVE Final    Comment: (NOTE) SARS-CoV-2 target nucleic acids are NOT DETECTED.  The SARS-CoV-2 RNA is generally detectable in upper and lower respiratory specimens during the acute phase of infection. The lowest concentration of SARS-CoV-2 viral copies this assay can detect is 250 copies / mL. A negative result does not preclude SARS-CoV-2 infection and should not be used as the sole basis for treatment or other patient management decisions.  A negative result may occur with improper specimen collection / handling, submission of specimen other than nasopharyngeal swab, presence of viral mutation(s) within the areas targeted by this assay, and inadequate number of viral copies (<250 copies / mL). A negative result must be combined with clinical observations, patient history, and epidemiological information.  Fact Sheet for Patients:   StrictlyIdeas.no  Fact Sheet for Healthcare Providers: BankingDealers.co.za  This test is not yet approved or  cleared by the [place] FDA and has been authorized for detection  and/or diagnosis of SARS-CoV-2 by FDA under an Emergency Use Authorization (EUA).  This EUA will remain in effect (meaning this test can be used) for the duration of the COVID-19 declaration under Section 564(b)(1) of the Act, 21 U.S.C. section 360bbb-3(b)(1), unless the authorization is terminated or revoked sooner.  Performed at [facility], [address], [place], [place] [address]   Blood Culture (routine x 2)     Status: None (Preliminary result)    Collection Time: 06/25/20  3:27 PM   Specimen: BLOOD  Result Value Ref Range Status   Specimen Description BLOOD BLOOD LEFT HAND  Final   Special Requests   Final    BOTTLES DRAWN AEROBIC AND ANAEROBIC Blood Culture results may not be optimal due to an inadequate volume of blood received in culture bottles   Culture   Final    NO GROWTH 2 DAYS Performed at [facility], [address], [place], [place] [address]    Report Status PENDING  Incomplete  Blood Culture (routine x 2)     Status: None (Preliminary result)   Collection Time: 06/25/20  3:27 PM   Specimen: BLOOD  Result Value Ref Range Status   Specimen Description BLOOD BLOOD RIGHT HAND  Final   Special Requests   Final    BOTTLES DRAWN AEROBIC AND ANAEROBIC Blood Culture results may not be optimal due to an inadequate volume of blood received in culture bottles   Culture   Final    NO GROWTH 2 DAYS Performed at [facility], [address], [place], [place] [address]    Report Status PENDING  Incomplete    Procedures and diagnostic studies:  CT Head Wo Contrast  Result Date: 06/25/2020 CLINICAL DATA:  Decreased level of consciousness for 3 days EXAM: CT HEAD WITHOUT CONTRAST TECHNIQUE: Contiguous axial images were obtained from the base of the skull through the vertex without intravenous contrast. COMPARISON:  01/09/2013 FINDINGS: Brain: No acute infarct or hemorrhage. Lateral ventricles and midline structures are stable. Chronic lacunar infarct left basal ganglia. No acute extra-axial fluid collections. No mass effect. Vascular: No hyperdense vessel or unexpected calcification. Skull: Normal. Negative for fracture or focal lesion. Sinuses/Orbits: No acute finding. Other: None. IMPRESSION: 1. No acute intracranial process. Electronically Signed   By: Randa Ngo M.D.   On: 06/25/2020 15:57   DG Chest Port 1 View  Result Date: 06/25/2020 CLINICAL DATA:  Sepsis EXAM: PORTABLE CHEST 1 VIEW COMPARISON:   01/13/2013 FINDINGS: Cardiac shadow is within normal limits. The lungs are clear bilaterally. No focal infiltrate is seen. Degenerative changes of the thoracic spine are noted. IMPRESSION: No active disease. Electronically Signed   By: Inez Catalina M.D.   On: 06/25/2020 16:11               LOS: 2 days   Dorion Petillo  [facility]   Pager on [email or web address]. If 7PM-7AM, please contact night-coverage at [email or web address]     06/27/2020, 10:52 AM

## 2020-06-28 LAB — GLUCOSE, CAPILLARY
Glucose-Capillary: 125 mg/dL — ABNORMAL HIGH (ref 70–99)
Glucose-Capillary: 196 mg/dL — ABNORMAL HIGH (ref 70–99)
Glucose-Capillary: 150 mg/dL — ABNORMAL HIGH (ref 70–99)

## 2020-06-28 LAB — BASIC METABOLIC PANEL
Anion gap: 8 (ref 5–15)
BUN: 42 mg/dL — ABNORMAL HIGH (ref 6–20)
CO2: 28 mmol/L (ref 22–32)
Calcium: 8.1 mg/dL — ABNORMAL LOW (ref 8.9–10.3)
Chloride: 107 mmol/L (ref 98–111)
Creatinine, Ser: 1.39 mg/dL — ABNORMAL HIGH (ref 0.44–1.00)
GFR, Estimated: 43 mL/min — ABNORMAL LOW (ref >60)
Glucose, Bld: 142 mg/dL — ABNORMAL HIGH (ref 70–99)
Potassium: 3.5 mmol/L (ref 3.5–5.1)
Sodium: 143 mmol/L (ref 135–145)

## 2020-06-28 LAB — HEMOGLOBIN A1C
Hgb A1c MFr Bld: 6.3 % — ABNORMAL HIGH (ref 4.8–5.6)
Mean Plasma Glucose: 134.11 mg/dL

## 2020-06-28 NOTE — Plan of Care (Signed)
  Problem: Education: Goal: Knowledge of General Education information will improve Description: Including pain rating scale, medication(s)/side effects and non-pharmacologic comfort measures 06/28/2020 1312 by Cristela Blue, RN Outcome: Progressing 06/28/2020 1312 by Cristela Blue, RN Outcome: Progressing   Problem: Health Behavior/Discharge Planning: Goal: Ability to manage health-related needs will improve 06/28/2020 1312 by Cristela Blue, RN Outcome: Progressing 06/28/2020 1312 by Cristela Blue, RN Outcome: Progressing   Problem: Clinical Measurements: Goal: Ability to maintain clinical measurements within normal limits will improve 06/28/2020 1312 by Cristela Blue, RN Outcome: Progressing 06/28/2020 1312 by Cristela Blue, RN Outcome: Progressing Goal: Will remain free from infection 06/28/2020 1312 by Cristela Blue, RN Outcome: Progressing 06/28/2020 1312 by Cristela Blue, RN Outcome: Progressing Goal: Diagnostic test results will improve 06/28/2020 1312 by Cristela Blue, RN Outcome: Progressing 06/28/2020 1312 by Cristela Blue, RN Outcome: Progressing Goal: Respiratory complications will improve 06/28/2020 1312 by Cristela Blue, RN Outcome: Progressing 06/28/2020 1312 by Cristela Blue, RN Outcome: Progressing Goal: Cardiovascular complication will be avoided 06/28/2020 1312 by Cristela Blue, RN Outcome: Progressing 06/28/2020 1312 by Cristela Blue, RN Outcome: Progressing   Problem: Activity: Goal: Risk for activity intolerance will decrease 06/28/2020 1312 by Cristela Blue, RN Outcome: Progressing 06/28/2020 1312 by Cristela Blue, RN Outcome: Progressing   Problem: Nutrition: Goal: Adequate nutrition will be maintained 06/28/2020 1312 by Cristela Blue, RN Outcome: Progressing 06/28/2020 1312 by Cristela Blue, RN Outcome: Progressing   Problem: Coping: Goal: Level of anxiety will decrease 06/28/2020 1312 by Cristela Blue, RN Outcome:  Progressing 06/28/2020 1312 by Cristela Blue, RN Outcome: Progressing   Problem: Elimination: Goal: Will not experience complications related to bowel motility 06/28/2020 1312 by Cristela Blue, RN Outcome: Progressing 06/28/2020 1312 by Cristela Blue, RN Outcome: Progressing Goal: Will not experience complications related to urinary retention 06/28/2020 1312 by Cristela Blue, RN Outcome: Progressing 06/28/2020 1312 by Cristela Blue, RN Outcome: Progressing   Problem: Pain Managment: Goal: General experience of comfort will improve 06/28/2020 1312 by Cristela Blue, RN Outcome: Progressing 06/28/2020 1312 by Cristela Blue, RN Outcome: Progressing   Problem: Safety: Goal: Ability to remain free from injury will improve 06/28/2020 1312 by Cristela Blue, RN Outcome: Progressing 06/28/2020 1312 by Cristela Blue, RN Outcome: Progressing   Problem: Skin Integrity: Goal: Risk for impaired skin integrity will decrease 06/28/2020 1312 by Cristela Blue, RN Outcome: Progressing 06/28/2020 1312 by Cristela Blue, RN Outcome: Progressing

## 2020-06-28 NOTE — Discharge Summary (Signed)
Physician Discharge Summary  Jacqueline Shelton QRF:[id number] DOB: 1960-11-24 DOA: 06/25/2020  PCP: Maryella Shivers, MD  Admit date: 06/25/2020 Discharge date: 06/29/2020  Admitted From: SNF Disposition: SNF  Recommendations for Outpatient Follow-up:  1. Follow up with PCP in 1-2 weeks 2. Please obtain BMP/CBC in one week 3. Please follow up on the following pending results: None  Home Health: No Equipment/Devices: Discharge Condition: Stable CODE STATUS: Full Diet recommendation: Heart Healthy / Carb Modified   Brief/Interim Summary: Jacqueline Shelton is a 60 y.o. female with a known history of stroke with right-sided weakness, CHF, depression, type diabetes mellitus, hypertension, dyslipidemia, hypothyroidism and GERD, who presented to the emergency room with progressively worsening altered mental status.  Sepsis ruled out, there is no obvious source of infection.  There was some presumed UTI, urine cultures with multiple growth and blood cultures remain negative.  She did received antibiotics for 4 days.  Remained afebrile.  Patient appears at her baseline.  She cannot communicate clearly secondary to her prior stroke.  Found to have hypernatremia on admission, most likely secondary to dehydration.  Which resolved with IV fluid.  She was also found to have hyperkalemia and AKI with history of CKD stage IV.  Potassium within normal limit and creatinine appears to be at baseline on discharge.  Patient has an history of type 2 diabetes mellitus.  A1c of 6.3 which makes it well controlled.  Patient will continue with her Eliquis.  Please evaluate for any sign of dehydration before giving home dose of Lasix.  She might not need it.  Appears euvolemic on discharge.  She needs a lot of help to stay well-hydrated and with p.o. intake. Keep her well-hydrated by offering water multiple times a day.  She will follow-up with her providers.  Discharge Diagnoses:  Principal Problem:    Sepsis due to gram-negative UTI [facility]) Active Problems:   Hypernatremia   AKI (acute kidney injury) ([place])   Acute metabolic encephalopathy   Discharge Instructions  Discharge Instructions    Diet - low sodium heart healthy   Complete by: As directed    Increase activity slowly   Complete by: As directed      Allergies as of 06/28/2020   No Known Allergies     Medication List    TAKE these medications   acetaminophen 325 MG tablet Commonly known as: TYLENOL Take 650 mg by mouth every 6 (six) hours as needed.   apixaban 5 MG Tabs tablet Commonly known as: ELIQUIS Take 5 mg by mouth 2 (two) times daily.   furosemide 20 MG tablet Commonly known as: LASIX Take 20 mg by mouth daily.   gabapentin 300 MG capsule Commonly known as: NEURONTIN Take 300 mg by mouth 2 (two) times daily.   Glucagon Emergency 1 MG Kit Inject 1 mg into the muscle every hour as needed (for hypo related to TYPE 2 DIABETES MELLITUS WITH DIABETIC POLYNEUROPATHY). Admin for BS less than 60 and/or s/s of hypoglycemia   insulin aspart protamine- aspart (70-30) 100 UNIT/ML injection Commonly known as: NOVOLOG MIX 70/30 Inject 30 Units into the skin 2 (two) times daily with a meal.   Insulin Lispro Prot & Lispro (75-25) 100 UNIT/ML Kwikpen Commonly known as: HUMALOG 75/25 MIX Inject 10 Units into the skin 2 (two) times daily. Hold if BS is less than 150 or not eating   levothyroxine 50 MCG tablet Commonly known as: SYNTHROID Take 50 mcg by mouth daily before breakfast.   lisinopril 2.5  MG tablet Commonly known as: ZESTRIL Take 2.5 mg by mouth daily.   lubiprostone 24 MCG capsule Commonly known as: AMITIZA Take 1 capsule by mouth daily.   magnesium oxide 400 MG tablet Commonly known as: MAG-OX Take 400 mg by mouth 2 (two) times daily.   menthol-cetylpyridinium 3 MG lozenge Commonly known as: CEPACOL Take 1 lozenge by mouth as needed for sore throat.   metFORMIN 500 MG tablet Commonly known  as: GLUCOPHAGE Take 500 mg by mouth daily.   metoprolol succinate 25 MG 24 hr tablet Commonly known as: TOPROL-XL Take 25 mg by mouth daily.   omeprazole 20 MG capsule Commonly known as: PRILOSEC Take 20 mg by mouth daily.   polyethylene glycol 17 g packet Commonly known as: MIRALAX / GLYCOLAX Take 17 g by mouth daily.   pravastatin 20 MG tablet Commonly known as: PRAVACHOL Take 20 mg by mouth at bedtime.   senna 8.6 MG Tabs tablet Commonly known as: SENOKOT Take 2 tablets by mouth at bedtime.   traMADol 50 MG tablet Commonly known as: ULTRAM Take 100 mg by mouth 3 (three) times daily.   trolamine salicylate 10 % cream Commonly known as: ASPERCREME Apply 1 application topically as needed for muscle pain.       Follow-up Information    Maryella Shivers, MD. Schedule an appointment as soon as possible for a visit.   Specialty: Family Medicine Contact information: [address] [place] [address] [phone]              No Known Allergies  Consultations:  None  Procedures/Studies: CT Head Wo Contrast  Result Date: 06/25/2020 CLINICAL DATA:  Decreased level of consciousness for 3 days EXAM: CT HEAD WITHOUT CONTRAST TECHNIQUE: Contiguous axial images were obtained from the base of the skull through the vertex without intravenous contrast. COMPARISON:  01/09/2013 FINDINGS: Brain: No acute infarct or hemorrhage. Lateral ventricles and midline structures are stable. Chronic lacunar infarct left basal ganglia. No acute extra-axial fluid collections. No mass effect. Vascular: No hyperdense vessel or unexpected calcification. Skull: Normal. Negative for fracture or focal lesion. Sinuses/Orbits: No acute finding. Other: None. IMPRESSION: 1. No acute intracranial process. Electronically Signed   By: Randa Ngo M.D.   On: 06/25/2020 15:57   DG Chest Port 1 View  Result Date: 06/25/2020 CLINICAL DATA:  Sepsis EXAM: PORTABLE CHEST 1 VIEW COMPARISON:   01/13/2013 FINDINGS: Cardiac shadow is within normal limits. The lungs are clear bilaterally. No focal infiltrate is seen. Degenerative changes of the thoracic spine are noted. IMPRESSION: No active disease. Electronically Signed   By: Inez Catalina M.D.   On: 06/25/2020 16:11     Subjective: Patient was able to communicate somewhat but her words were not very cleared.  Denies any pain.  Denies any dysuria.  Discharge Exam: Vitals:   06/28/20 0816 06/28/20 1131  BP: 120/67 133/71  Pulse: 88 95  Resp: 17 17  Temp: 98.8 F (37.1 C) 98.9 F (37.2 C)  SpO2: 100% 100%   Vitals:   06/28/20 0006 06/28/20 0525 06/28/20 0816 06/28/20 1131  BP: 139/68 122/68 120/67 133/71  Pulse: 95 90 88 95  Resp: '20 18 17 17  ' Temp: 97.7 F (36.5 C) 98.3 F (36.8 C) 98.8 F (37.1 C) 98.9 F (37.2 C)  TempSrc: Oral Oral    SpO2: 100% 100% 100% 100%  Weight:      Height:        General: Pt is alert, awake, not  in acute distress, can communicate but not in clear words.  Bedridden and appears cognitively impaired. Cardiovascular: RRR, S1/S2 +, no rubs, no gallops Respiratory: CTA bilaterally, no wheezing, no rhonchi Abdominal: Soft, NT, ND, bowel sounds + Extremities: no edema, no cyanosis   The results of significant diagnostics from this hospitalization (including imaging, microbiology, ancillary and laboratory) are listed below for reference.    Microbiology: Recent Results (from the past 240 hour(s))  Urine culture     Status: Abnormal   Collection Time: 06/25/20  3:07 PM   Specimen: Urine, Random  Result Value Ref Range Status   Specimen Description   Final    URINE, RANDOM Performed at [facility], [address] [place], [place] [address]    Special Requests   Final    NONE Performed at [facility], [address] [place], [place] [address]    Culture MULTIPLE SPECIES PRESENT, SUGGEST RECOLLECTION (A)  Final   Report Status 06/27/2020 FINAL  Final  SARS  Coronavirus 2 by RT PCR (hospital order, performed in [facility] hospital lab) Nasopharyngeal Nasopharyngeal Swab     Status: None   Collection Time: 06/25/20  3:27 PM   Specimen: Nasopharyngeal Swab  Result Value Ref Range Status   SARS Coronavirus 2 NEGATIVE NEGATIVE Final    Comment: (NOTE) SARS-CoV-2 target nucleic acids are NOT DETECTED.  The SARS-CoV-2 RNA is generally detectable in upper and lower respiratory specimens during the acute phase of infection. The lowest concentration of SARS-CoV-2 viral copies this assay can detect is 250 copies / mL. A negative result does not preclude SARS-CoV-2 infection and should not be used as the sole basis for treatment or other patient management decisions.  A negative result may occur with improper specimen collection / handling, submission of specimen other than nasopharyngeal swab, presence of viral mutation(s) within the areas targeted by this assay, and inadequate number of viral copies (<250 copies / mL). A negative result must be combined with clinical observations, patient history, and epidemiological information.  Fact Sheet for Patients:   StrictlyIdeas.no  Fact Sheet for Healthcare Providers: BankingDealers.co.za  This test is not yet approved or  cleared by the [place] FDA and has been authorized for detection and/or diagnosis of SARS-CoV-2 by FDA under an Emergency Use Authorization (EUA).  This EUA will remain in effect (meaning this test can be used) for the duration of the COVID-19 declaration under Section 564(b)(1) of the Act, 21 U.S.C. section 360bbb-3(b)(1), unless the authorization is terminated or revoked sooner.  Performed at [facility], [address] [place], [place] [address]   Blood Culture (routine x 2)     Status: None (Preliminary result)   Collection Time: 06/25/20  3:27 PM   Specimen: BLOOD  Result Value Ref Range Status   Specimen  Description BLOOD BLOOD LEFT HAND  Final   Special Requests   Final    BOTTLES DRAWN AEROBIC AND ANAEROBIC Blood Culture results may not be optimal due to an inadequate volume of blood received in culture bottles   Culture   Final    NO GROWTH 3 DAYS Performed at [facility], [address] [place],  [address]    Report Status PENDING  Incomplete  Blood Culture (routine x 2)     Status: None (Preliminary result)   Collection Time: 06/25/20  3:27 PM   Specimen: BLOOD  Result Value Ref Range Status   Specimen Description BLOOD BLOOD RIGHT HAND  Final  Special Requests   Final    BOTTLES DRAWN AEROBIC AND ANAEROBIC Blood Culture results may not be optimal due to an inadequate volume of blood received in culture bottles   Culture   Final    NO GROWTH 3 DAYS Performed at [facility], [address] [place], [place] [address]    Report Status PENDING  Incomplete     Labs: BNP (last 3 results) Recent Labs    06/25/20 1507  BNP 64.1   Basic Metabolic Panel: Recent Labs  Lab 06/25/20 1940 06/26/20 0328 06/26/20 1453 06/27/20 0549 06/28/20 0413  NA 158* 162* 155* 148* 143  K 4.5 4.3 4.2 3.9 3.5  CL 116* 122* 116* 111 107  CO2 '25 28 26 26 28  ' GLUCOSE 258* 189* 342* 333* 142*  BUN 94* 91* 78* 61* 42*  CREATININE 3.26* 2.91* 2.39* 1.97* 1.39*  CALCIUM 8.1* 8.4* 8.5* 8.3* 8.1*   Liver Function Tests: Recent Labs  Lab 06/25/20 1507  AST 19  ALT 8  ALKPHOS 78  BILITOT 1.8*  PROT 7.7  ALBUMIN 3.6   No results for input(s): LIPASE, AMYLASE in the last 168 hours. No results for input(s): AMMONIA in the last 168 hours. CBC: Recent Labs  Lab 06/25/20 1507 06/26/20 0328 06/27/20 0549  WBC 20.6* 17.7* 10.1  NEUTROABS 16.5*  --  7.3  HGB 12.9 11.8* 10.1*  HCT 44.0 39.9 34.4*  MCV 90.0 89.1 89.1  PLT 350 252 173   Cardiac Enzymes: No results for input(s): CKTOTAL, CKMB, CKMBINDEX, TROPONINI in the last 168 hours. BNP: Invalid input(s):  POCBNP CBG: Recent Labs  Lab 06/27/20 1121 06/27/20 1559 06/27/20 2120 06/28/20 0609 06/28/20 1147  GLUCAP 249* 215* 134* 125* 196*   D-Dimer No results for input(s): DDIMER in the last 72 hours. Hgb A1c Recent Labs    06/27/20 0549  HGBA1C 6.3*   Lipid Profile No results for input(s): CHOL, HDL, LDLCALC, TRIG, CHOLHDL, LDLDIRECT in the last 72 hours. Thyroid function studies Recent Labs    06/25/20 1940  TSH 0.337*   Anemia work up No results for input(s): VITAMINB12, FOLATE, FERRITIN, TIBC, IRON, RETICCTPCT in the last 72 hours. Urinalysis    Component Value Date/Time   COLORURINE YELLOW (A) 06/25/2020 1507   APPEARANCEUR HAZY (A) 06/25/2020 1507   APPEARANCEUR Cloudy 03/11/2014 1213   LABSPEC 1.014 06/25/2020 1507   LABSPEC 1.013 03/11/2014 1213   PHURINE 6.0 06/25/2020 1507   GLUCOSEU NEGATIVE 06/25/2020 1507   GLUCOSEU Negative 03/11/2014 1213   HGBUR MODERATE (A) 06/25/2020 1507   BILIRUBINUR NEGATIVE 06/25/2020 1507   BILIRUBINUR Negative 03/11/2014 1213   KETONESUR 5 (A) 06/25/2020 1507   PROTEINUR 30 (A) 06/25/2020 1507   NITRITE NEGATIVE 06/25/2020 1507   LEUKOCYTESUR MODERATE (A) 06/25/2020 1507   LEUKOCYTESUR 3+ 03/11/2014 1213   Sepsis Labs Invalid input(s): PROCALCITONIN,  WBC,  LACTICIDVEN Microbiology Recent Results (from the past 240 hour(s))  Urine culture     Status: Abnormal   Collection Time: 06/25/20  3:07 PM   Specimen: Urine, Random  Result Value Ref Range Status   Specimen Description   Final    URINE, RANDOM Performed at [facility], [address], [place], [place] [address]    Special Requests   Final    NONE Performed at [facility], [address] [place],  [address]    Culture MULTIPLE SPECIES PRESENT, SUGGEST RECOLLECTION (A)  Final   Report Status 06/27/2020 FINAL  Final  SARS  Coronavirus 2 by RT PCR (hospital order, performed in [facility] hospital lab) Nasopharyngeal Nasopharyngeal  Swab     Status: None   Collection Time: 06/25/20  3:27 PM   Specimen: Nasopharyngeal Swab  Result Value Ref Range Status   SARS Coronavirus 2 NEGATIVE NEGATIVE Final    Comment: (NOTE) SARS-CoV-2 target nucleic acids are NOT DETECTED.  The SARS-CoV-2 RNA is generally detectable in upper and lower respiratory specimens during the acute phase of infection. The lowest concentration of SARS-CoV-2 viral copies this assay can detect is 250 copies / mL. A negative result does not preclude SARS-CoV-2 infection and should not be used as the sole basis for treatment or other patient management decisions.  A negative result may occur with improper specimen collection / handling, submission of specimen other than nasopharyngeal swab, presence of viral mutation(s) within the areas targeted by this assay, and inadequate number of viral copies (<250 copies / mL). A negative result must be combined with clinical observations, patient history, and epidemiological information.  Fact Sheet for Patients:   StrictlyIdeas.no  Fact Sheet for Healthcare Providers: BankingDealers.co.za  This test is not yet approved or  cleared by the [place] FDA and has been authorized for detection and/or diagnosis of SARS-CoV-2 by FDA under an Emergency Use Authorization (EUA).  This EUA will remain in effect (meaning this test can be used) for the duration of the COVID-19 declaration under Section 564(b)(1) of the Act, 21 U.S.C. section 360bbb-3(b)(1), unless the authorization is terminated or revoked sooner.  Performed at [facility], [address] [place], [place] [address]   Blood Culture (routine x 2)     Status: None (Preliminary result)   Collection Time: 06/25/20  3:27 PM   Specimen: BLOOD  Result Value Ref Range Status   Specimen Description BLOOD BLOOD LEFT HAND  Final   Special Requests   Final    BOTTLES DRAWN AEROBIC AND ANAEROBIC  Blood Culture results may not be optimal due to an inadequate volume of blood received in culture bottles   Culture   Final    NO GROWTH 3 DAYS Performed at [facility], [address], [place], [place] [address]    Report Status PENDING  Incomplete  Blood Culture (routine x 2)     Status: None (Preliminary result)   Collection Time: 06/25/20  3:27 PM   Specimen: BLOOD  Result Value Ref Range Status   Specimen Description BLOOD BLOOD RIGHT HAND  Final   Special Requests   Final    BOTTLES DRAWN AEROBIC AND ANAEROBIC Blood Culture results may not be optimal due to an inadequate volume of blood received in culture bottles   Culture   Final    NO GROWTH 3 DAYS Performed at [facility], [address], [place], [place] [address]    Report Status PENDING  Incomplete    Time coordinating discharge: Over 30 minutes  SIGNED:  Lorella Nimrod, MD  [facility] 06/28/2020, 2:01 PM  If 7PM-7AM, please contact night-coverage [email or web address]  This record has been created using Systems analyst. Errors have been sought and corrected,but may not always be located. Such creation errors do not reflect on the standard of care.

## 2020-06-29 LAB — GLUCOSE, CAPILLARY
Glucose-Capillary: 129 mg/dL — ABNORMAL HIGH (ref 70–99)
Glucose-Capillary: 142 mg/dL — ABNORMAL HIGH (ref 70–99)
Glucose-Capillary: 143 mg/dL — ABNORMAL HIGH (ref 70–99)
Glucose-Capillary: 165 mg/dL — ABNORMAL HIGH (ref 70–99)
Glucose-Capillary: 178 mg/dL — ABNORMAL HIGH (ref 70–99)

## 2020-06-29 LAB — SARS CORONAVIRUS 2 (TAT 6-24 HRS): SARS Coronavirus 2: NEGATIVE

## 2020-06-29 NOTE — Progress Notes (Signed)
Jacqueline Wamboldt Jeffersis a 60 y.o.femalewith a known history ofstroke with right-sided weakness, CHF, depression, type diabetes mellitus, hypertension, dyslipidemia, hypothyroidism and GERD, who presented to the emergency room with progressively worsening altered mental status.  Sepsis ruled out, there is no obvious source of infection.  There was some presumed UTI, urine cultures with multiple growth and blood cultures remain negative.  She did received antibiotics for 4 days.  Remained afebrile.  She was severely dehydrated causing electrolyte abnormalities which were resolved.  Patient was at her baseline yesterday.  At baseline she has right hemiparesis and dysarthria.  She was discharged but daughter was not comfortable that she will leave yesterday so she remained here for 1 more night.  Patient was seen today, no acute changes.  Appears to be at baseline.  Eating at baseline.  Daughter was at bedside and agreeable for her to go back to her facility.  Patient had benign exam except right hemiparesis and dysarthria.

## 2020-06-29 NOTE — Progress Notes (Signed)
PT Cancellation Note  Patient Details Name: Jacqueline Shelton MRN: [id number] DOB: 1960-10-30   Cancelled Treatment:    Reason Eval/Treat Not Completed:  (Consult received and chart reviewed.  Per chart review and confirmed with CSW, patient planning for return to LTC at [facility] this date.  No skilled PT needs reported at this time.  Will complete initial order; please reconsult should needs change.)   Sharilyn [facility] H. Owens Shark, PT, DPT, NCS 06/29/20, 3:16 PM [phone]

## 2020-06-29 NOTE — Progress Notes (Signed)
Foley catheter removed per order of Dr. Reesa Chew. Pt tolerated well.

## 2020-06-29 NOTE — TOC Transition Note (Addendum)
Transition of Care [facility]) - CM/SW Discharge Note   Patient Details  Name: Jacqueline Shelton MRN: [id number] Date of Birth: 1960-08-09  Transition of Care [facility]) CM/SW Contact:  Magnus Ivan, LCSW Phone Number: 06/29/2020, 1:51 PM   Clinical Narrative:   Patient to discharge back to [facility] today, Room 93B. Confirmed with [place] at [facility] who confirmed she has everything that she needs for patient to return there. CSW updated MD, RN, daughter. Asked RN to call report and MD to submit DC Summary. Medical Necessity Form, DNR, and Face Sheet placed in Discharge Packet by patient chart. [facility] arranged for 5:30 pick up.   Final next level of care: Skilled Nursing Facility Barriers to Discharge: Barriers Resolved   Patient Goals and CMS Choice Patient states their goals for this hospitalization and ongoing recovery are:: return to SNF CMS Medicare.gov Compare Post Acute Care list provided to:: Patient Represenative (must comment) Choice offered to / list presented to : Adult Children  Discharge Placement              Patient chooses bed at: [facility] Patient to be transferred to facility by: EMS Name of family member notified: [name] - daughter Patient and family notified of of transfer: 06/29/20  Discharge Plan and Services                                     Social Determinants of Health (SDOH) Interventions     Readmission Risk Interventions No flowsheet data found.

## 2020-06-29 NOTE — Care Management Important Message (Signed)
Important Message  Patient Details  Name: SIDONIA NUTTER MRN: [id number] Date of Birth: 1960-06-18   Medicare Important Message Given:  Yes   Roseanna Koplin E Naureen Benton, LCSW 06/29/2020, 1:58 PM

## 2020-06-29 NOTE — Progress Notes (Signed)
This RN provided report to the nurse assuming care of the patient at [facility], Charlesetta Garibaldi. All outstanding questions resolved.

## 2020-06-29 NOTE — Plan of Care (Signed)
  Problem: Education: Goal: Knowledge of General Education information will improve Description: Including pain rating scale, medication(s)/side effects and non-pharmacologic comfort measures 06/29/2020 1135 by Cristela Blue, RN Outcome: Progressing 06/29/2020 1135 by Cristela Blue, RN Outcome: Progressing   Problem: Health Behavior/Discharge Planning: Goal: Ability to manage health-related needs will improve 06/29/2020 1135 by Cristela Blue, RN Outcome: Progressing 06/29/2020 1135 by Cristela Blue, RN Outcome: Progressing   Problem: Clinical Measurements: Goal: Ability to maintain clinical measurements within normal limits will improve 06/29/2020 1135 by Cristela Blue, RN Outcome: Progressing 06/29/2020 1135 by Cristela Blue, RN Outcome: Progressing Goal: Will remain free from infection 06/29/2020 1135 by Cristela Blue, RN Outcome: Progressing 06/29/2020 1135 by Cristela Blue, RN Outcome: Progressing Goal: Diagnostic test results will improve 06/29/2020 1135 by Cristela Blue, RN Outcome: Progressing 06/29/2020 1135 by Cristela Blue, RN Outcome: Progressing Goal: Respiratory complications will improve 06/29/2020 1135 by Cristela Blue, RN Outcome: Progressing 06/29/2020 1135 by Cristela Blue, RN Outcome: Progressing Goal: Cardiovascular complication will be avoided 06/29/2020 1135 by Cristela Blue, RN Outcome: Progressing 06/29/2020 1135 by Cristela Blue, RN Outcome: Progressing   Problem: Activity: Goal: Risk for activity intolerance will decrease 06/29/2020 1135 by Cristela Blue, RN Outcome: Progressing 06/29/2020 1135 by Cristela Blue, RN Outcome: Progressing   Problem: Nutrition: Goal: Adequate nutrition will be maintained 06/29/2020 1135 by Cristela Blue, RN Outcome: Progressing 06/29/2020 1135 by Cristela Blue, RN Outcome: Progressing   Problem: Coping: Goal: Level of anxiety will decrease 06/29/2020 1135 by Cristela Blue, RN Outcome:  Progressing 06/29/2020 1135 by Cristela Blue, RN Outcome: Progressing   Problem: Elimination: Goal: Will not experience complications related to bowel motility 06/29/2020 1135 by Cristela Blue, RN Outcome: Progressing 06/29/2020 1135 by Cristela Blue, RN Outcome: Progressing Goal: Will not experience complications related to urinary retention 06/29/2020 1135 by Cristela Blue, RN Outcome: Progressing 06/29/2020 1135 by Cristela Blue, RN Outcome: Progressing   Problem: Pain Managment: Goal: General experience of comfort will improve 06/29/2020 1135 by Cristela Blue, RN Outcome: Progressing 06/29/2020 1135 by Cristela Blue, RN Outcome: Progressing   Problem: Safety: Goal: Ability to remain free from injury will improve 06/29/2020 1135 by Cristela Blue, RN Outcome: Progressing 06/29/2020 1135 by Cristela Blue, RN Outcome: Progressing   Problem: Skin Integrity: Goal: Risk for impaired skin integrity will decrease 06/29/2020 1135 by Cristela Blue, RN Outcome: Progressing 06/29/2020 1135 by Cristela Blue, RN Outcome: Progressing

## 2020-06-29 NOTE — Progress Notes (Signed)
Patient refused 0001 vitals

## 2020-06-29 NOTE — Progress Notes (Signed)
This RN let daughter know that transport is at bedside to transport patient to [facility].   This RN removed pt's PIV. Cannula intact. Pt tolerated well.

## 2020-06-30 LAB — CULTURE, BLOOD (ROUTINE X 2)
Culture: NO GROWTH
Culture: NO GROWTH

## 2020-07-01 ENCOUNTER — Other Ambulatory Visit: Payer: Self-pay

## 2020-07-01 ENCOUNTER — Non-Acute Institutional Stay: Payer: Medicare Other | Admitting: Nurse Practitioner

## 2020-07-01 ENCOUNTER — Encounter: Payer: Self-pay | Admitting: Nurse Practitioner

## 2020-07-01 VITALS — BP 110/72 | HR 68 | Temp 98.1°F | Wt 209.0 lb

## 2020-07-01 DIAGNOSIS — I639 Cerebral infarction, unspecified: Secondary | ICD-10-CM

## 2020-07-01 DIAGNOSIS — Z515 Encounter for palliative care: Secondary | ICD-10-CM

## 2020-07-01 NOTE — Progress Notes (Signed)
[facility] Consult Note Telephone: [phone]  Fax: [phone]  PATIENT NAME: Jacqueline Shelton DOB: 18-Jun-1960 MRN: [id number] [place] RESPONSIBLE PARTY:Self Jennfer Gassen daughter [phone] or [phone]  Recommendations: 1.ACP: DNR; Treat what is treatable;  2.Painsecondary to will continue to monitor on pain scale, monitor efficacy vs adverse side effects. Continue with current pain regimen withTramadol, gabapentin  3.Palliative care encounter; Palliative medicine team will continue to support patient, patient's family, and medical team. Visit consisted of counseling and education dealing with the complex and emotionally intense issues of symptom management and palliative care in the setting of serious and potentially life-threatening illness  4. F/u visit 2 months for ongoing monitoring mobility, appetite, weight, pain, disease progression  I spent 60 minutes providing this consultation, start at 11:30am. More than 50% of the time in this consultation was spent coordinating communication.   HISTORY OF PRESENT ILLNESS:  Jacqueline Shelton is a 60 y.o. year old female with multiple medical problems including CVA, hemiplegia, hemiparesis, dysphasia, aphasia, congestive heart failure, diabetes, hypertension, hyperlipidemia, hypothyroidism, GERD, bilateral cataracts, depression, anxiety.Jacqueline Shelton continues to reside in [place] at [facility]. Jacqueline Shelton does remain functional quadriplegic require staff for all transfers come and mobility training in positioning, bathing, dressing, toileting is she is incontinent. Jacqueline Shelton does feed herself after tray setup it does take her some time. Current weight is 209. Jacqueline Shelton had a hospitalization 1 / 72 / 2022 to 1 / 30 / 2,020 24 sepsis presumed UTI, dehydration,  received antibiotic therapy,  fluids found to be hyperkalemic and AKI with history of CKD stage IV. Improved and return to [facility] where she continues to reside. Jacqueline. Shelton is followed by Podiatry at the facility. Since Jacqueline Shelton has returned to the facility staff endorses Jacqueline. Shelton has been feeling better. Jacqueline Shelton continues to sleep quite a bit. Staff endorses no other changes or concerns. At present Jacqueline Shelton is lying in bed. Jacqueline. Shelton appears debilitated but comfortable. No visitors present. I visited and observed Jacqueline Shelton. We talked about purpose of Palliative care visit. Jacqueline Shelton in agreement. We talked about how she was feeling. Jacqueline Shelton endorses she is okay. We talked about symptoms of pain and shortness of breath what she denies. We talked about recent hospitalization. We talked about Jacqueline Shelton appetite. Currently Jacqueline Shelton had a bag of cookies and Dr. Malachi Bonds in front of her. We talked about facility, her daughter visiting. We talked about medical goals of care. Jacqueline Shelton endorses she was glad to be back from the hospital. We talked about role of Palliative care and plan of care. Emotional support provided. I have attempted to contact Jacqueline [place] daughter Conley Shelton for update on Palliative care visit. No new changes at present time. Will revisit in 1 month for ongoing discussions,  monitoring weights and appetite. Updated nursing staff new new changes at present time.  Palliative Care was asked to help to continue to address goals of care.   CODE STATUS: DNR  PPS: 30% HOSPICE ELIGIBILITY/DIAGNOSIS: TBD  PAST MEDICAL HISTORY:  Past Medical History:  Diagnosis Date  . Anxiety   . Cataract   . CHF (congestive heart failure) ([place])   . CVA (cerebral vascular accident) ([place])   . Depression   . DM (diabetes mellitus) ([place])   . Dysphagia   . GERD (gastroesophageal reflux disease)   . Hemiplegia ([place])   .  HLD (hyperlipidemia)   . HTN (hypertension)   . Hypothyroidism     SOCIAL HX:   Social History   Tobacco Use  . Smoking status: Not on file  . Smokeless tobacco: Not on file  Substance Use Topics  . Alcohol use: Not on file    ALLERGIES: No Known Allergies   PHYSICAL EXAM:   General: debilitated, chronically ill, pleasant female Cardiovascular: regular rate and rhythm Pulmonary: clear ant fields Extremities: no edema, no joint deformities; atrophy,  Neurological: functionally quadriplegic  Darrall Strey Ihor Gully, NP

## 2020-07-08 ENCOUNTER — Non-Acute Institutional Stay: Payer: Medicare Other | Admitting: Nurse Practitioner

## 2020-07-08 ENCOUNTER — Other Ambulatory Visit: Payer: Self-pay

## 2020-07-08 ENCOUNTER — Encounter: Payer: Self-pay | Admitting: Nurse Practitioner

## 2020-07-08 DIAGNOSIS — Z515 Encounter for palliative care: Secondary | ICD-10-CM

## 2020-07-08 DIAGNOSIS — I639 Cerebral infarction, unspecified: Secondary | ICD-10-CM

## 2020-07-08 NOTE — Progress Notes (Signed)
Therapist, nutritional Palliative Care Consult Note Telephone: [phone]  Fax: [phone]  PATIENT NAME: Jacqueline Shelton DOB: January 07, 1961 MRN: [id number] PRIMARY CARE PROVIDER:[facility] RESPONSIBLE PARTY:Self Jacqueline Shelton daughter [phone] or [phone]  Recommendations: 1.ACP: DNR; Treat what is treatable;  2.Painsecondary to will continue to monitor on pain scale, monitor efficacy vs adverse side effects. Continue with current pain regimen withTramadol, gabapentin  3.Palliative care encounter; Palliative medicine team will continue to support patient, patient's family, and medical team. Visit consisted of counseling and education dealing with the complex and emotionally intense issues of symptom management and palliative care in the setting of serious and potentially life-threatening illness  4. F/u visit 2 months for ongoing monitoring mobility, appetite, weight, pain, disease progression  I spent 40 minutes providing this consultation starting at 12:00pm. More than 50% of the time in this consultation was spent coordinating communication.   HISTORY OF PRESENT ILLNESS:  Jacqueline Shelton is a 60 y.o. year old female with multiple medical problems including CVA, hemiplegia, hemiparesis, dysphasia, aphasia, congestive heart failure, diabetes, hypertension, hyperlipidemia, hypothyroidism, GERD, bilateral cataracts, depression, anxiety.Jacqueline Shelton continues to reside in [facility] at [facility]. Jacqueline Shelton does require total ADL dependents returning, positioning, toileting as she does remaining continent. Jacqueline Shelton has since returned from hospitalization is now having to be fed which is new for her. Staff endorses appetite has ferret varies each day. Staff endorses observing Jacqueline Shelton she does seem to be more interactive the last few days. At present Jacqueline Shelton is lying in bed. Jacqueline  Shelton appears to build hated, chronically ill, no distress. No visitors present. I visited and observed Jacqueline Shelton. We talked about purpose of Palliative care visit. Jacqueline Shelton nodded yes. Jacqueline Shelton does make eye contact and attempt to interact though it is becoming more difficult to understand what she is trying to relay. Jacqueline Shelton deny symptoms of painter shortness of breath. We talked about her appetite and she said it was so so. Who talked about having staff feed her. Jacqueline Shelton endorses that it is difficult for her as she was used to doing that on her own. Limited discussion with more difficulty understanding what Jacqueline Shelton is trying to relay. Jacqueline Shelton was cooperative with assessment. Emotional support provided. Medical goals reviewed. I have attempted to contact her daughter Jacqueline Shelton for update on Palliative care visit though no new changes recommended at this time will continue current plan of care. I have updated nursing staff  Palliative Care was asked to help to continue to address goals of care.   CODE STATUS: DNR  PPS: 30% HOSPICE ELIGIBILITY/DIAGNOSIS: TBD  PAST MEDICAL HISTORY:  Past Medical History:  Diagnosis Date  . Anxiety   . Cataract   . CHF (congestive heart failure) ([facility])   . CVA (cerebral vascular accident) ([facility])   . Depression   . DM (diabetes mellitus) ([facility])   . Dysphagia   . GERD (gastroesophageal reflux disease)   . Hemiplegia ([facility])   . HLD (hyperlipidemia)   . HTN (hypertension)   . Hypothyroidism     SOCIAL HX:  Social History   Tobacco Use  . Smoking status: Not on file  . Smokeless tobacco: Not on file  Substance Use Topics  . Alcohol use: Not on file    ALLERGIES: No Known Allergies   PHYSICAL EXAM:   General: debilitated, pleasant, chronically ill female Cardiovascular: regular rate and rhythm Pulmonary: clear  ant fields Abdomen: soft, nontender, + bowel sounds Extremities: muscle wasting; atrophy Neurological: functional  quadriplegic  Jacqueline Weide Ihor Gully, NP

## 2020-07-15 ENCOUNTER — Encounter: Payer: Self-pay | Admitting: Nurse Practitioner

## 2020-07-15 ENCOUNTER — Non-Acute Institutional Stay: Payer: Medicare Other | Admitting: Nurse Practitioner

## 2020-07-15 DIAGNOSIS — I639 Cerebral infarction, unspecified: Secondary | ICD-10-CM

## 2020-07-15 DIAGNOSIS — R1310 Dysphagia, unspecified: Secondary | ICD-10-CM

## 2020-07-15 DIAGNOSIS — Z515 Encounter for palliative care: Secondary | ICD-10-CM

## 2020-07-15 NOTE — Progress Notes (Signed)
[facility] Consult Note Telephone: [phone]  Fax: [phone]  PATIENT NAME: Jacqueline Shelton DOB: 06-29-1960 MRN: [id number]  PRIMARY CARE PROVIDER:  [place] RESPONSIBLE PARTY:Self Jacqueline Shelton daughter [phone] or [phone]; Jacqueline Shelton [phone]  Recommendations: 1.ACP: DNR; Treat what is treatable;refer to Hospice, Hospice physicians agree with eligibility.   2.Painsecondary to will continue to monitor on pain scale, monitor efficacy vs adverse side effects. Continue with current pain regimen withTramadol, gabapentin  3.Palliative care encounter; Palliative medicine team will continue to support patient, patient's family, and medical team. Visit consisted of counseling and education dealing with the complex and emotionally intense issues of symptom management and palliative care in the setting of serious and potentially life-threatening illness  I spent 60 minutes providing this consultation, started at 12:00pm. More than 50% of the time in this consultation was spent coordinating communication.   HISTORY OF PRESENT ILLNESS:  Jacqueline Shelton is a 60 y.o. year old female with multiple medical problems including CVA, hemiplegia, hemiparesis, dysphasia, aphasia, congestive heart failure, diabetes, hypertension, hyperlipidemia, hypothyroidism, GERD, bilateral cataracts, depression, anxiety.Ms. Jacqueline Shelton continues to reside in [place] at [facility]. Asked by Claria Dice NP to see Ms Jacqueline Shelton today for decreased level of Consciousness, complex medical decision-making. Ms Jacqueline Shelton does require staff to turn, position, bath. Ms Jacqueline Shelton remains incontinent of bowel and bladder. Current weight 209 lbs; BMI 38.2; 11 pound weight gain. Two days ago Ms Jacqueline Shelton developed difficulty eating, swallowing. Staff endorses it has been unsafe to feed her  due to decreased responsiveness. Ms Jacqueline Shelton is a DNR and wishes are for comfort care at the facility. At present Ms Jacqueline Shelton is lying in bed, not responsive, appears comfortable with her daughter's at her bedside, Jacqueline Shelton and Jacqueline Shelton. I visited and observed Ms Jacqueline Shelton. Ms Jacqueline Shelton did not open her eyes to verbal cues. Respirations unlabored. I talked with Ms Jacqueline Shelton daughters at length about current clinical condition and concern for possible new stroke. We talked about medical goals of care with focus on comfort. We talked about role of Palliative care and plan of care. We talked about Hospice benefit through [facility] program. We talked about what services are provided. Jacqueline Shelton and Jacqueline Shelton both agree to [employer], order written and fax to Hospice. Case sent for review by Hospice Physicians and agreeable to eligibility to proceed. Anishia and Jacqueline Shelton talked about Palliative care following Ms Jacqueline Shelton for many years through Palliative care. We talked about grieving, coping strategies. Therapeutical listening, emotional support provided. Questions answered to satisfaction. Contact information. I updated nursing staff. I updated [employer] NP  Palliative Care was asked to help to continue to address goals of care.   CODE STATUS: DNR  PPS: 20% HOSPICE ELIGIBILITY/DIAGNOSIS: TBD  PAST MEDICAL HISTORY:  Past Medical History:  Diagnosis Date  . Anxiety   . Cataract   . CHF (congestive heart failure) ([place])   . CVA (cerebral vascular accident) ([place])   . Depression   . DM (diabetes mellitus) ([place])   . Dysphagia   . GERD (gastroesophageal reflux disease)   . Hemiplegia ([place])   . HLD (hyperlipidemia)   . HTN (hypertension)   . Hypothyroidism     SOCIAL HX:  Social History   Tobacco Use  . Smoking status: Not on file  . Smokeless tobacco: Not on file  Substance Use Topics  . Alcohol use: Not on file    ALLERGIES: No Known Allergies  PHYSICAL EXAM:   General: non-responding, debilitated  female Cardiovascular: regular rate and rhythm Pulmonary: clear ant fields Neurological: functional quadriplegic  Jacqueline Shelton Jacqueline Gully, NP

## 2020-07-16 ENCOUNTER — Other Ambulatory Visit: Payer: Self-pay

## 2020-07-28 DEATH — deceased
